# Patient Record
Sex: Female | Born: 1970
Health system: Southern US, Community
[De-identification: ages and names within clinical notes are randomized; demographics above are authoritative.]

---

## 2010-04-05 IMAGING — MG MM DIGITAL DIAGNOSTIC BILAT CAD
8 series · 8 of 8 positions shown · non-contrast
Comparison: None.

CLINICAL DATA: The patient notes palpable ridge-like area within
the medial inferior left breast.

DIGITAL DIAGNOSTIC BILATERAL MAMMOGRAM WITH CAD AND LEFT BREAST
ULTRASOUND:

[R CC (1 of 2)]
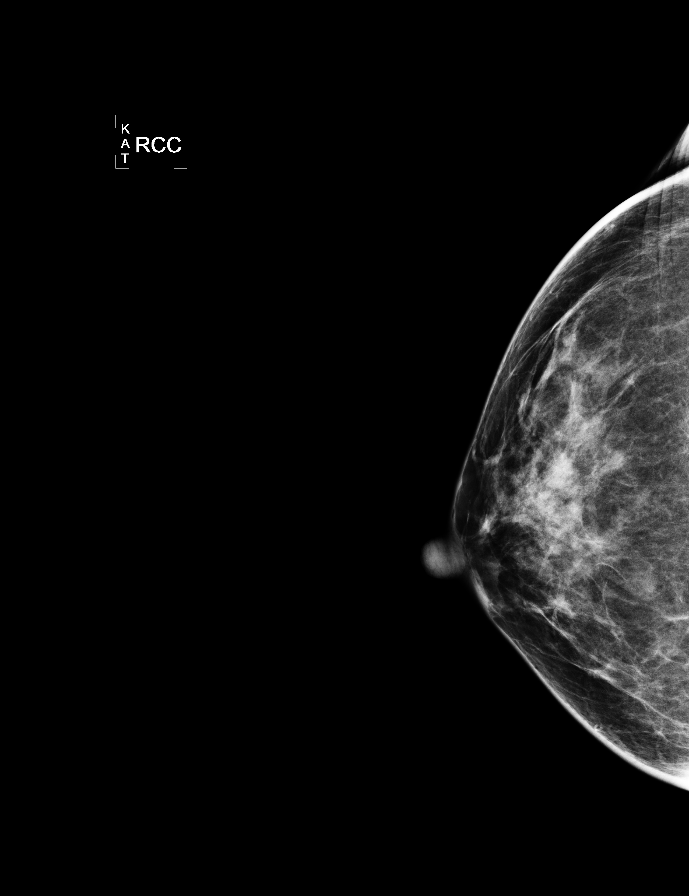

[L CC]
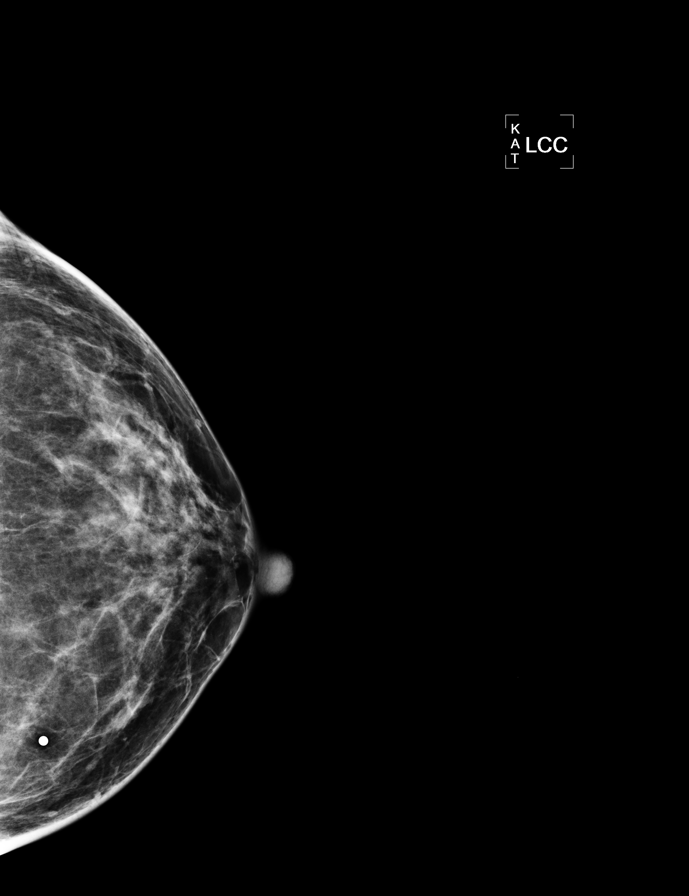

[L MLO (1 of 2)]
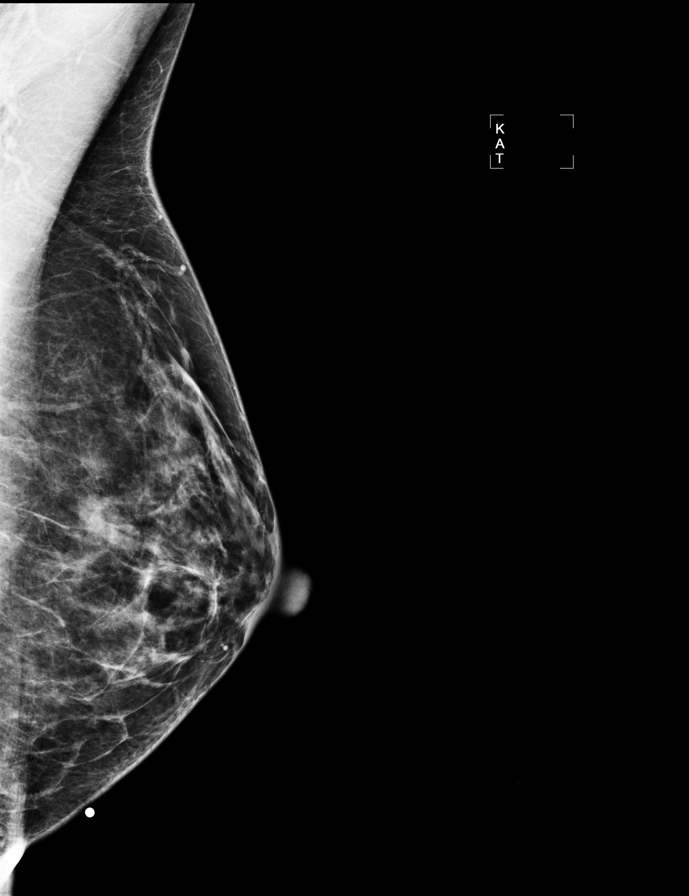

[R MLO (1 of 2)]
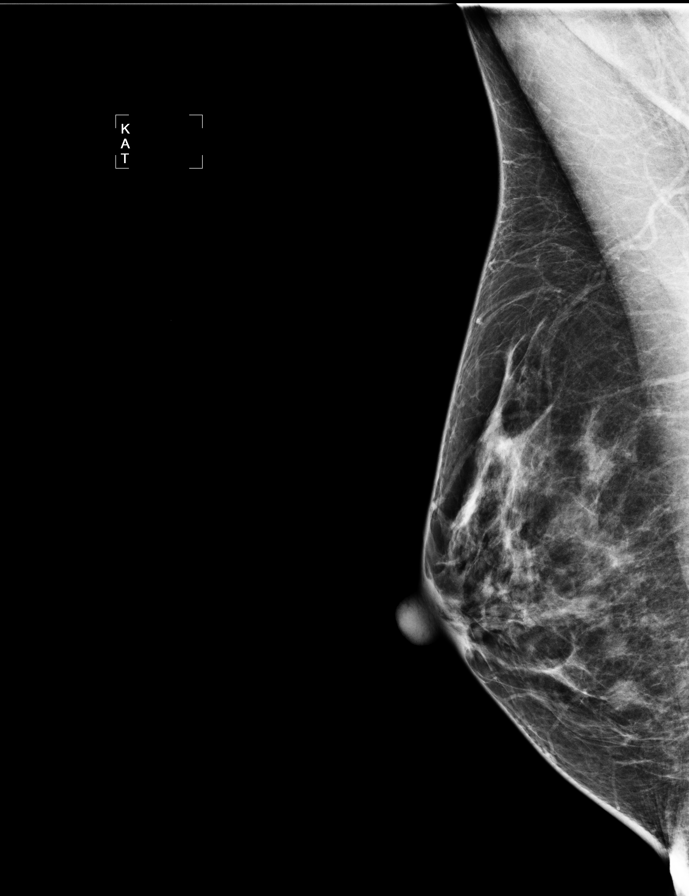

[L TAN]
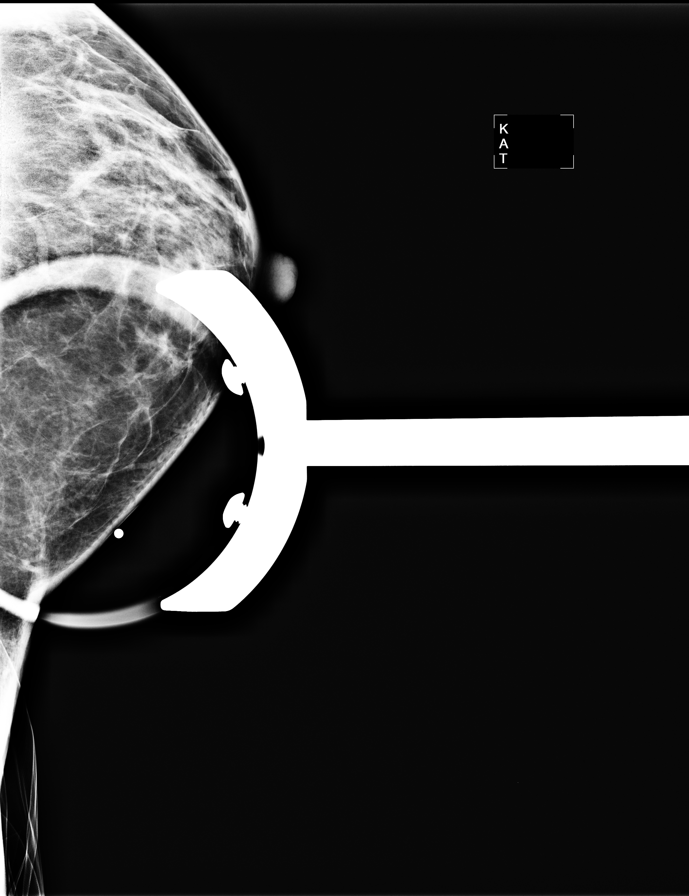

[L MLO (2 of 2)]
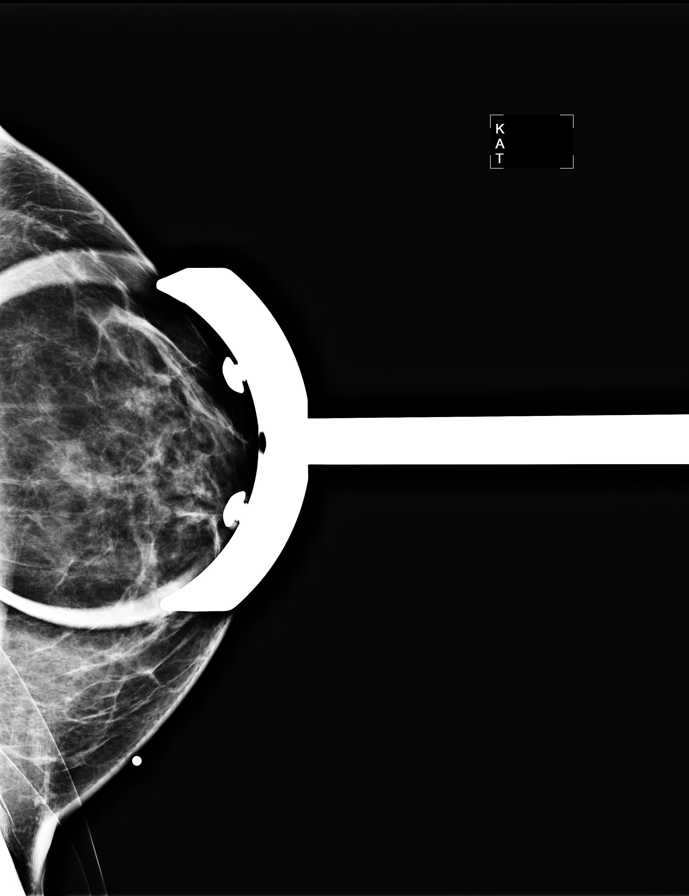

[R CC (2 of 2)]
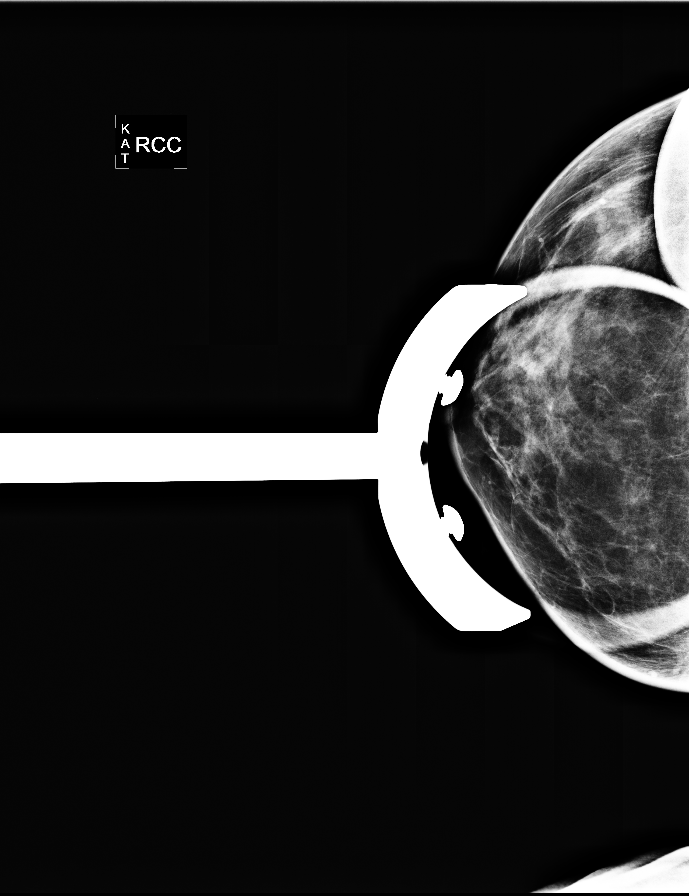

[R MLO (2 of 2)]
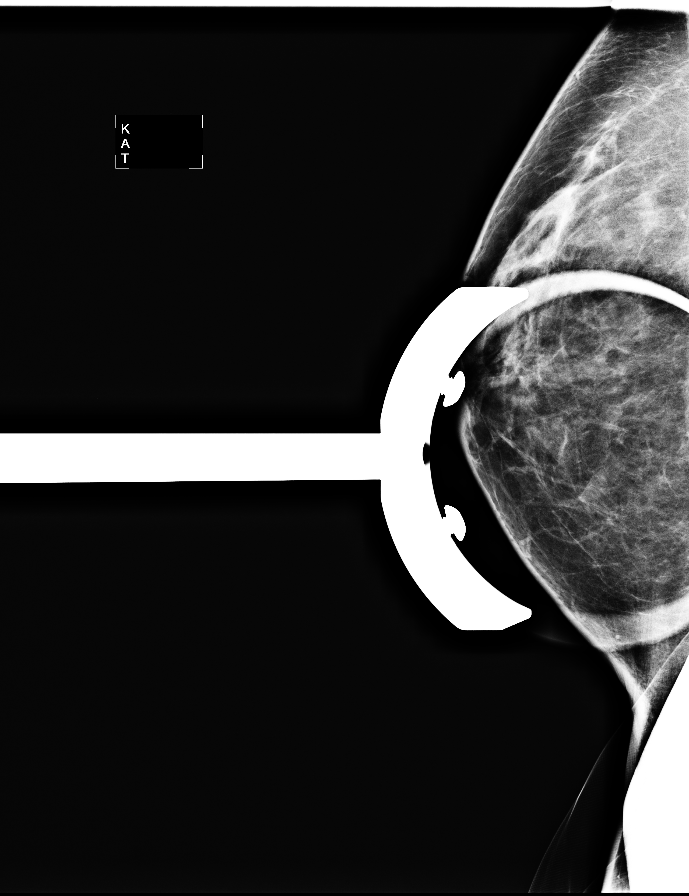

[8 of 8 positions shown; findings below may reference images not displayed]

FINDINGS: There is a scattered fibroglandular parenchymal pattern.
There is no mass, distortion, or worrisome calcification.
Mammographic images were processed with CAD.

On physical exam, the palpable ridge-like area located within the
medial inferior left breast corresponds to a prominent
costochondral junction.  There is no breast mass.

Ultrasound is performed, showing normal-appearing fibroglandular
tissue within the medial and inferior left breast.  The palpable
ridge-like area noted by the patient corresponds to the patient's
costochondral junction.
IMPRESSION: No findings worrisome for malignancy.  Recommend screening
mammography in 1 year.

BI-RADS CATEGORY 1:  Negative.

## 2011-10-30 IMAGING — CT CT HEAD W/O CM
1 series · 16 of 30 positions shown, 20 images · non-contrast
Comparison: None

CLINICAL DATA: 40-year-old female with headache and dizziness
following head injury.

CT HEAD WITHOUT CONTRAST
TECHNIQUE: Contiguous axial images were obtained from the base of
the skull through the vertex without contrast.

[Series 2: headseq 4.8 h45s · axial · 0.43mm/px · z∈[+1045,+1175]mm · 16 of 30 slices shown, 20 images]
[im 2/30  brain]
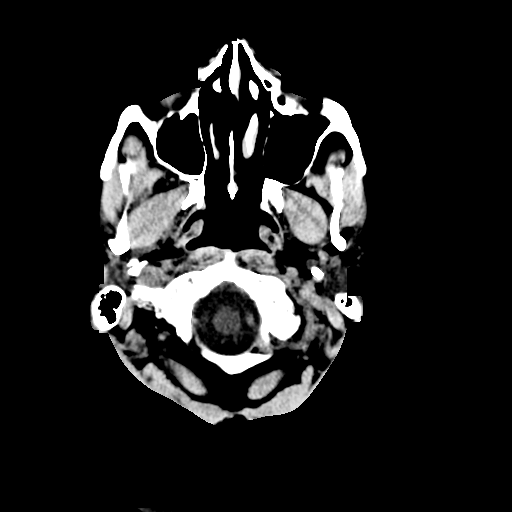
[im 2/30  bone]
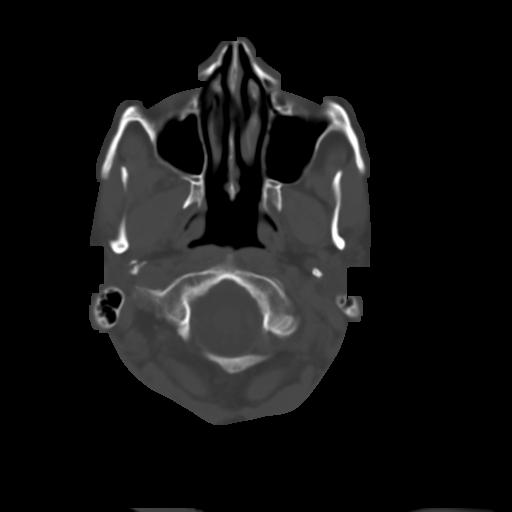
[im 4/30  brain]
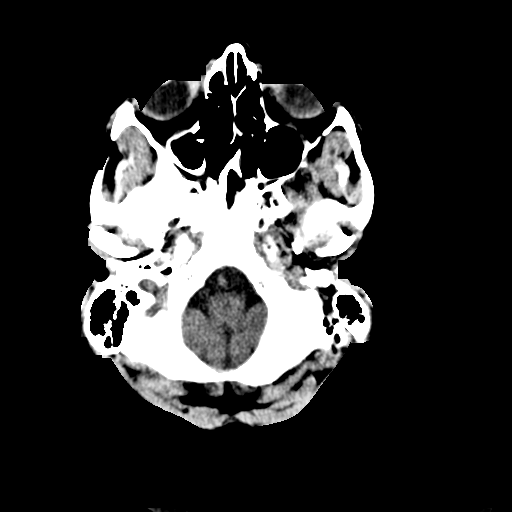
[im 6/30  brain]
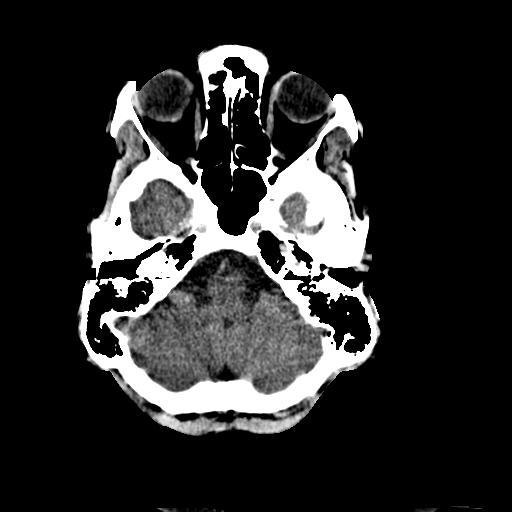
[im 8/30  brain]
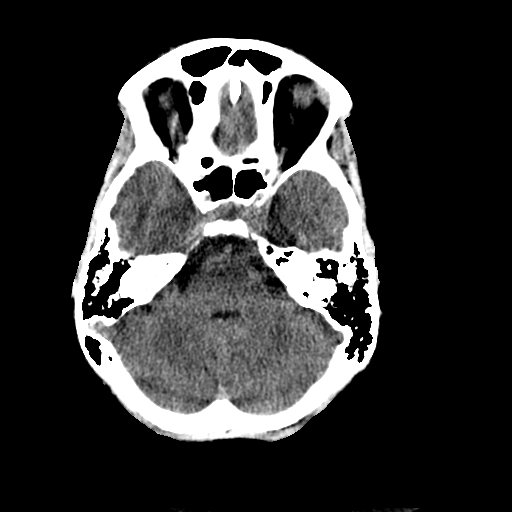
[im 9/30  brain]
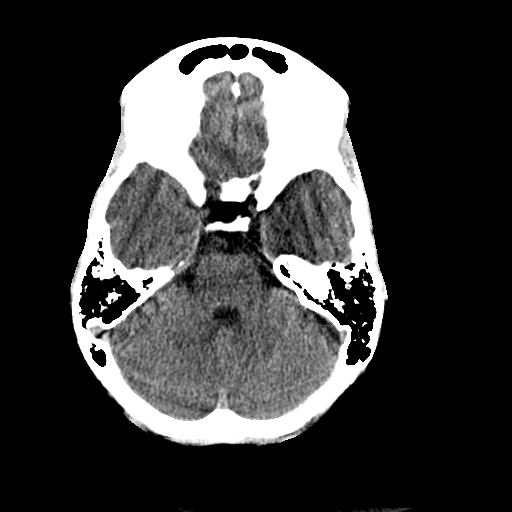
[im 9/30  bone]
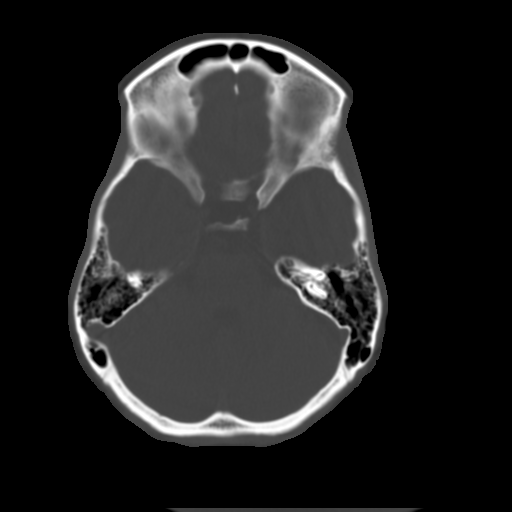
[im 11/30  brain]
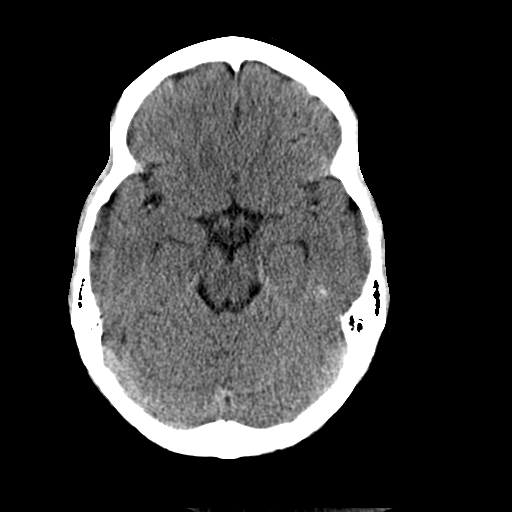
[im 13/30  brain]
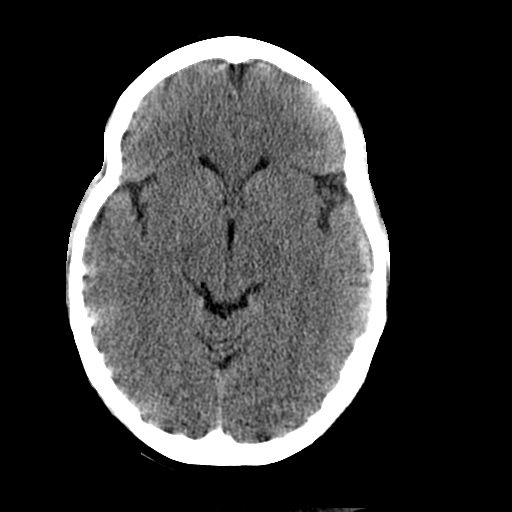
[im 15/30  brain]
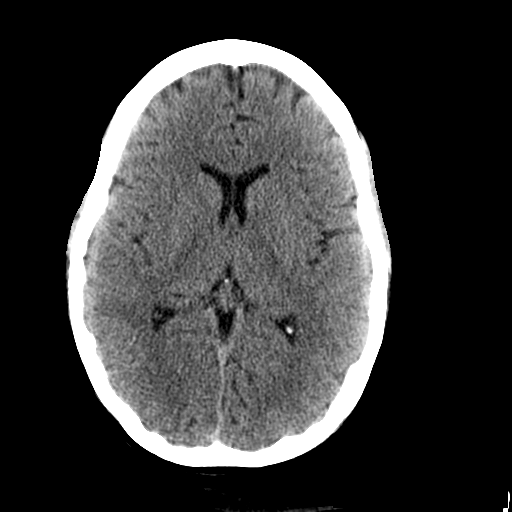
[im 16/30  brain]
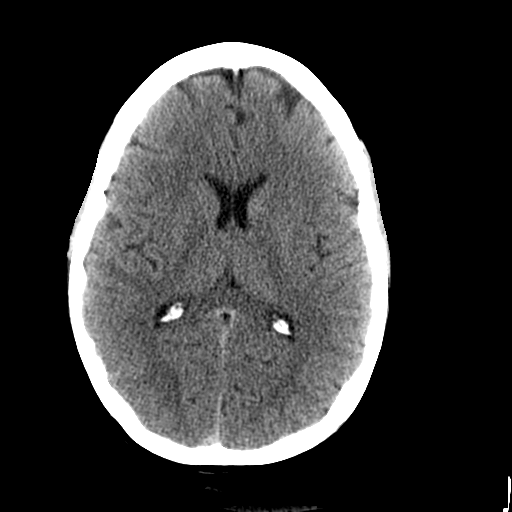
[im 16/30  bone]
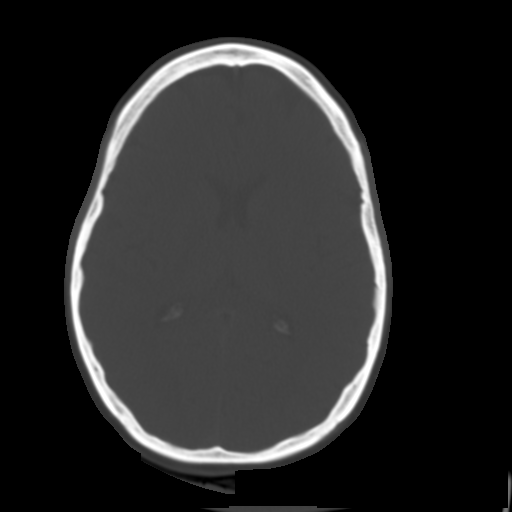
[im 18/30  brain]
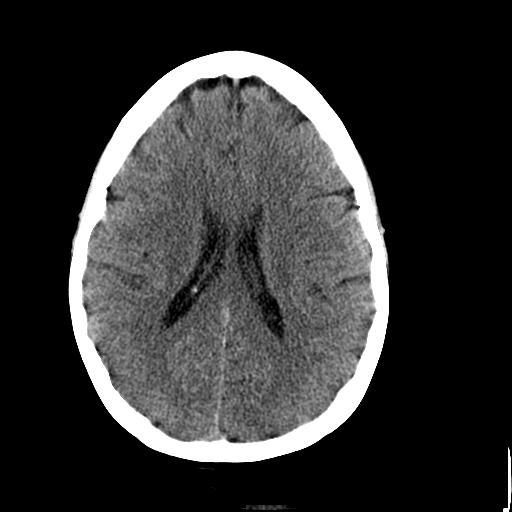
[im 20/30  brain]
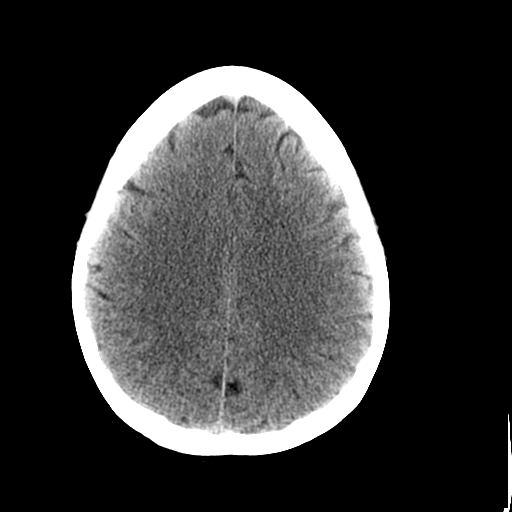
[im 22/30  brain]
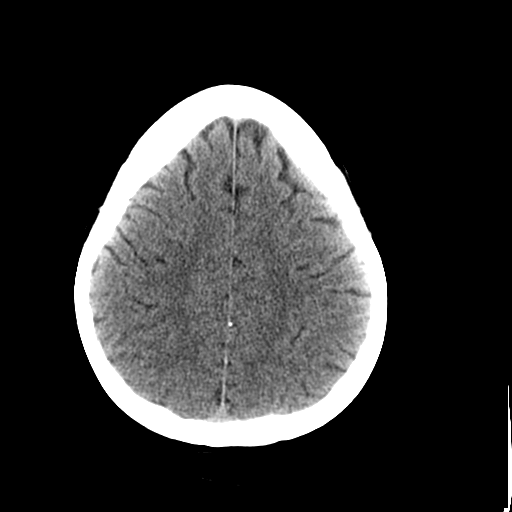
[im 23/30  brain]
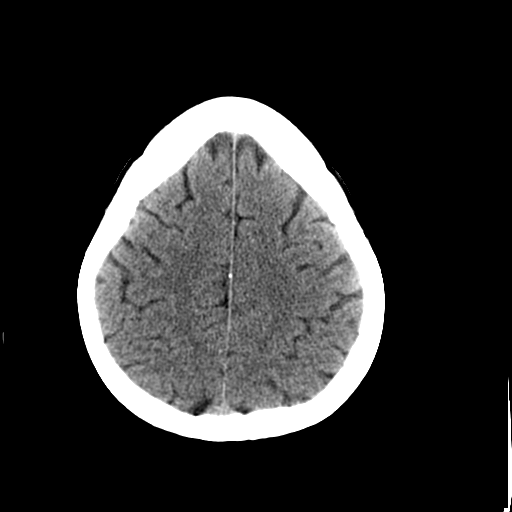
[im 23/30  bone]
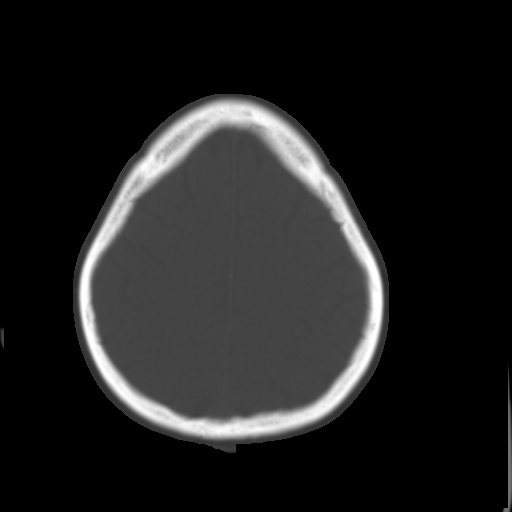
[im 25/30  brain]
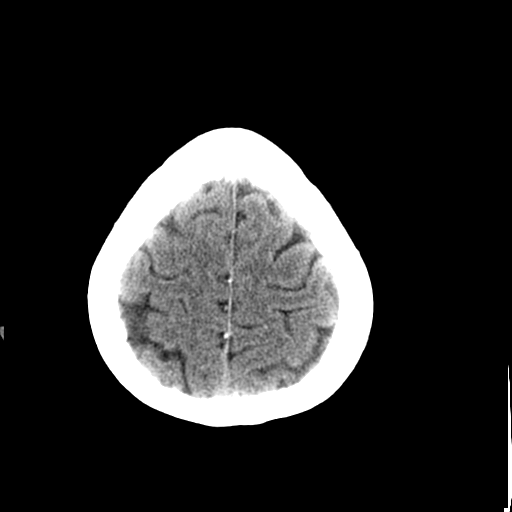
[im 27/30  brain]
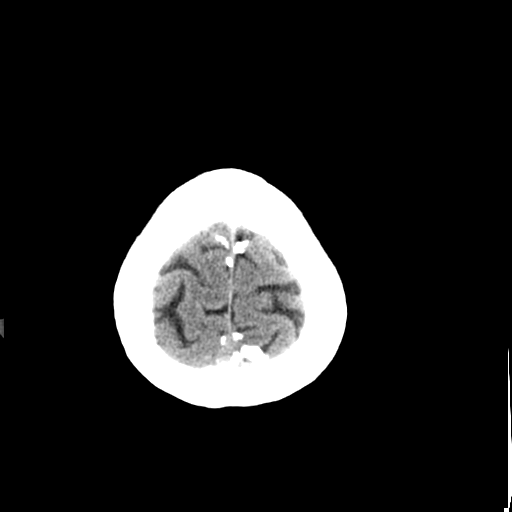
[im 29/30  brain]
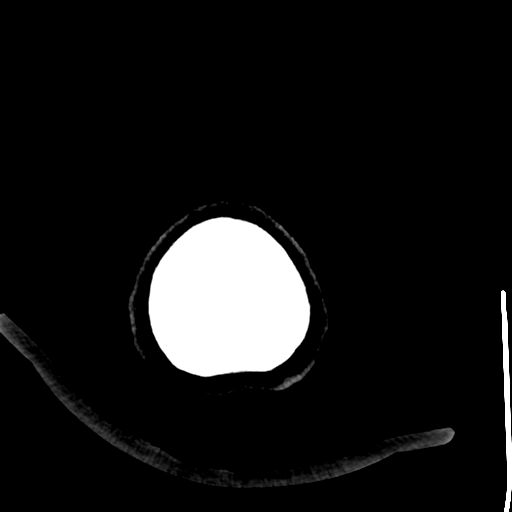

[16 of 30 positions shown; findings below may reference images not displayed]

FINDINGS: No intracranial abnormalities are identified, including
mass lesion or mass effect, hydrocephalus, extra-axial fluid
collection, midline shift, hemorrhage, or acute infarction.

The visualized bony calvarium is unremarkable.
IMPRESSION: Unremarkable noncontrast head CT.

## 2011-12-05 IMAGING — US US BREAST RIGHT
1 series · 4 of 4 positions shown · non-contrast
Comparison: [DATE] [DATE], [DATE], [DATE] [DATE], [DATE]

CLINICAL DATA: Called back from screening mammogram for possible
mass right breast

DIGITAL DIAGNOSTIC RIGHT MAMMOGRAM [DATE] AND RIGHT
BREAST ULTRASOUND:

[Series 1: us breast right · 4 of 4 slices shown]
[im 1/4]
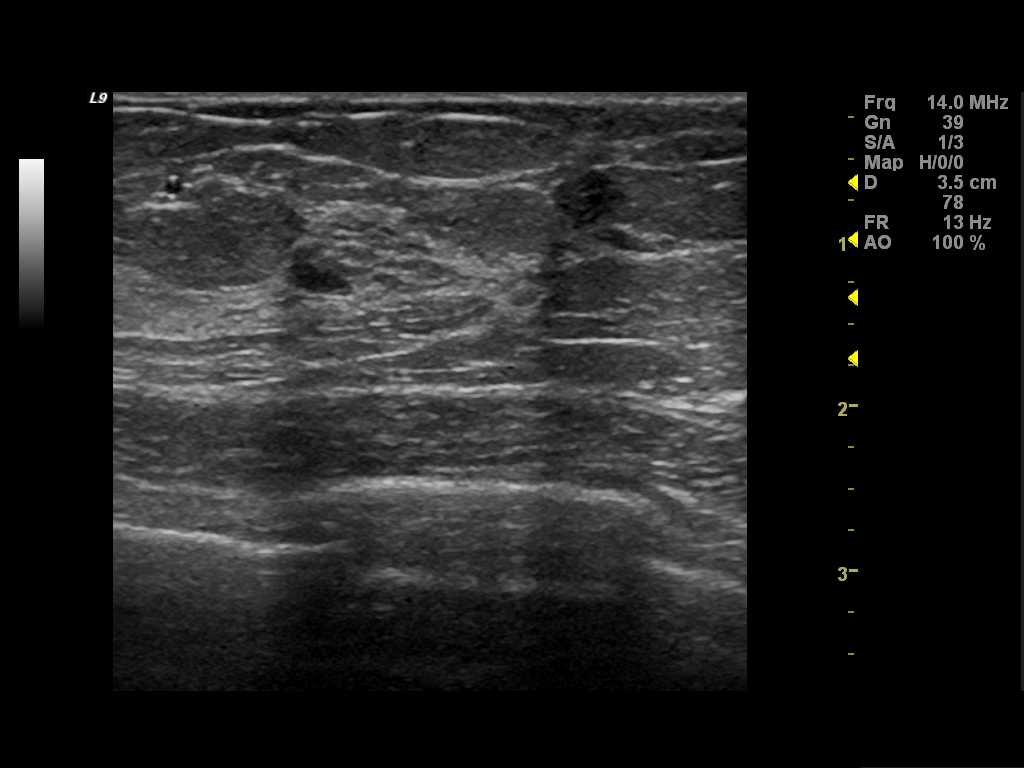
[im 2/4]
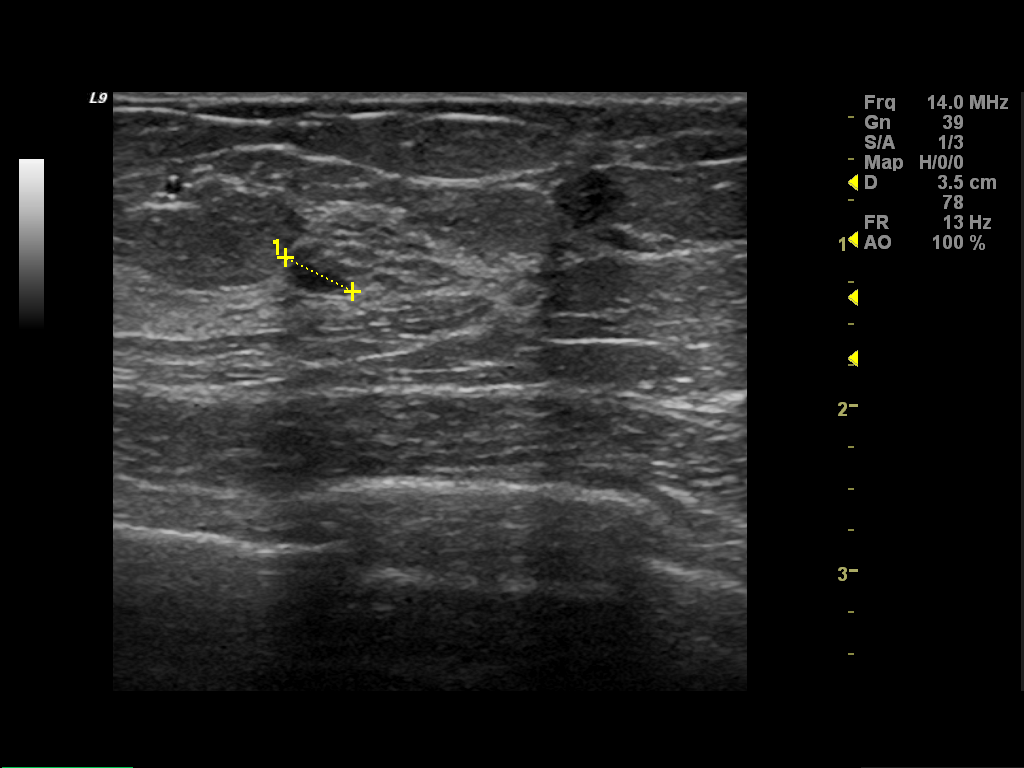
[im 3/4]
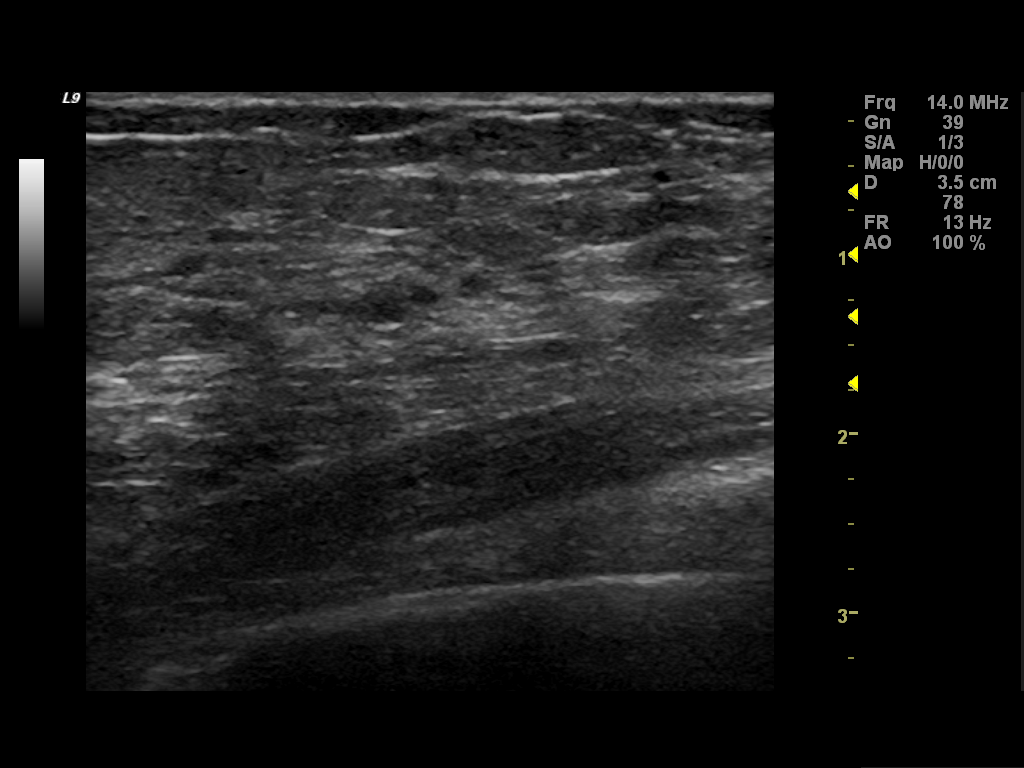
[im 4/4]
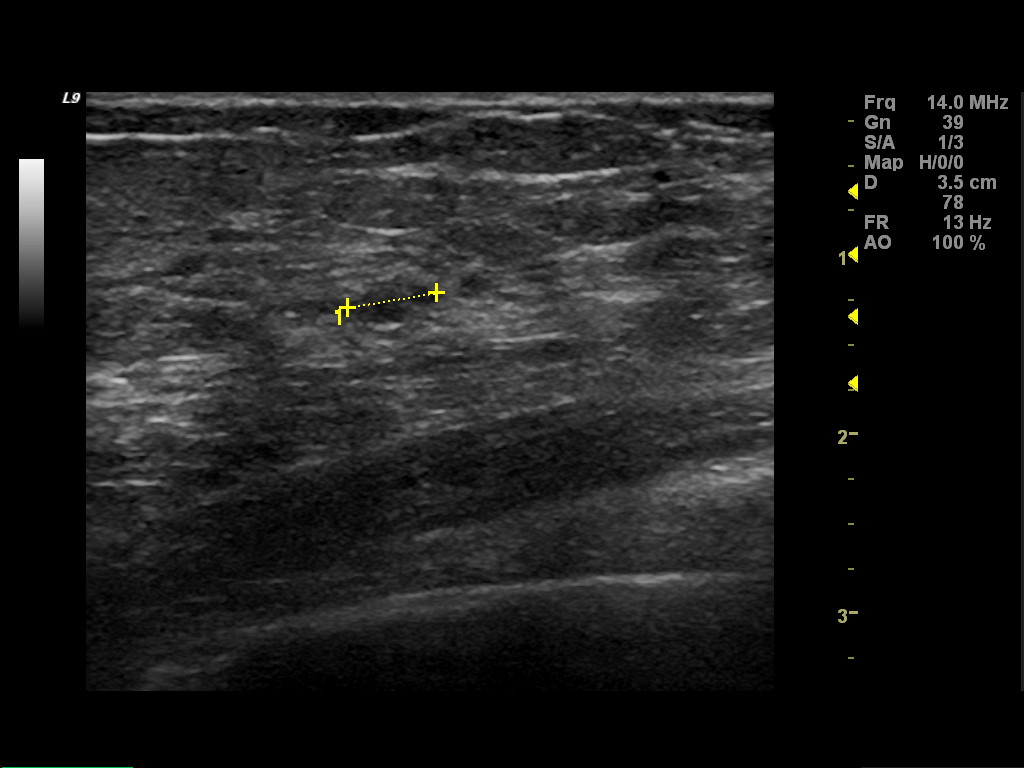

[4 of 4 positions shown; findings below may reference images not displayed]

FINDINGS: Spot compression CC and MLO views of the right breast
are submitted.  Previously questioned asymmetry does not persist on
additional views.  On the additional views, the breast parenchyma
is unchanged compared to previous mammograms.

Ultrasound is performed, showing a 0.5 cm oval hypoechoic lesion
with central increased echo texture at the right breast [DATE]
position 2 cm from the nipple consistent with intramammary lymph
node.
IMPRESSION: Benign findings, recommend routine screening mammogram back on
schedule.

BI-RADS CATEGORY 2:  Benign finding(s).

## 2011-12-05 IMAGING — MG MM DIGITAL DIAGNOSTIC UNILAT*R*
2 series · 2 of 2 positions shown · non-contrast
Comparison: [DATE] [DATE], [DATE], [DATE] [DATE], [DATE]

CLINICAL DATA: Called back from screening mammogram for possible
mass right breast

DIGITAL DIAGNOSTIC RIGHT MAMMOGRAM [DATE] AND RIGHT
BREAST ULTRASOUND:

[R CC]
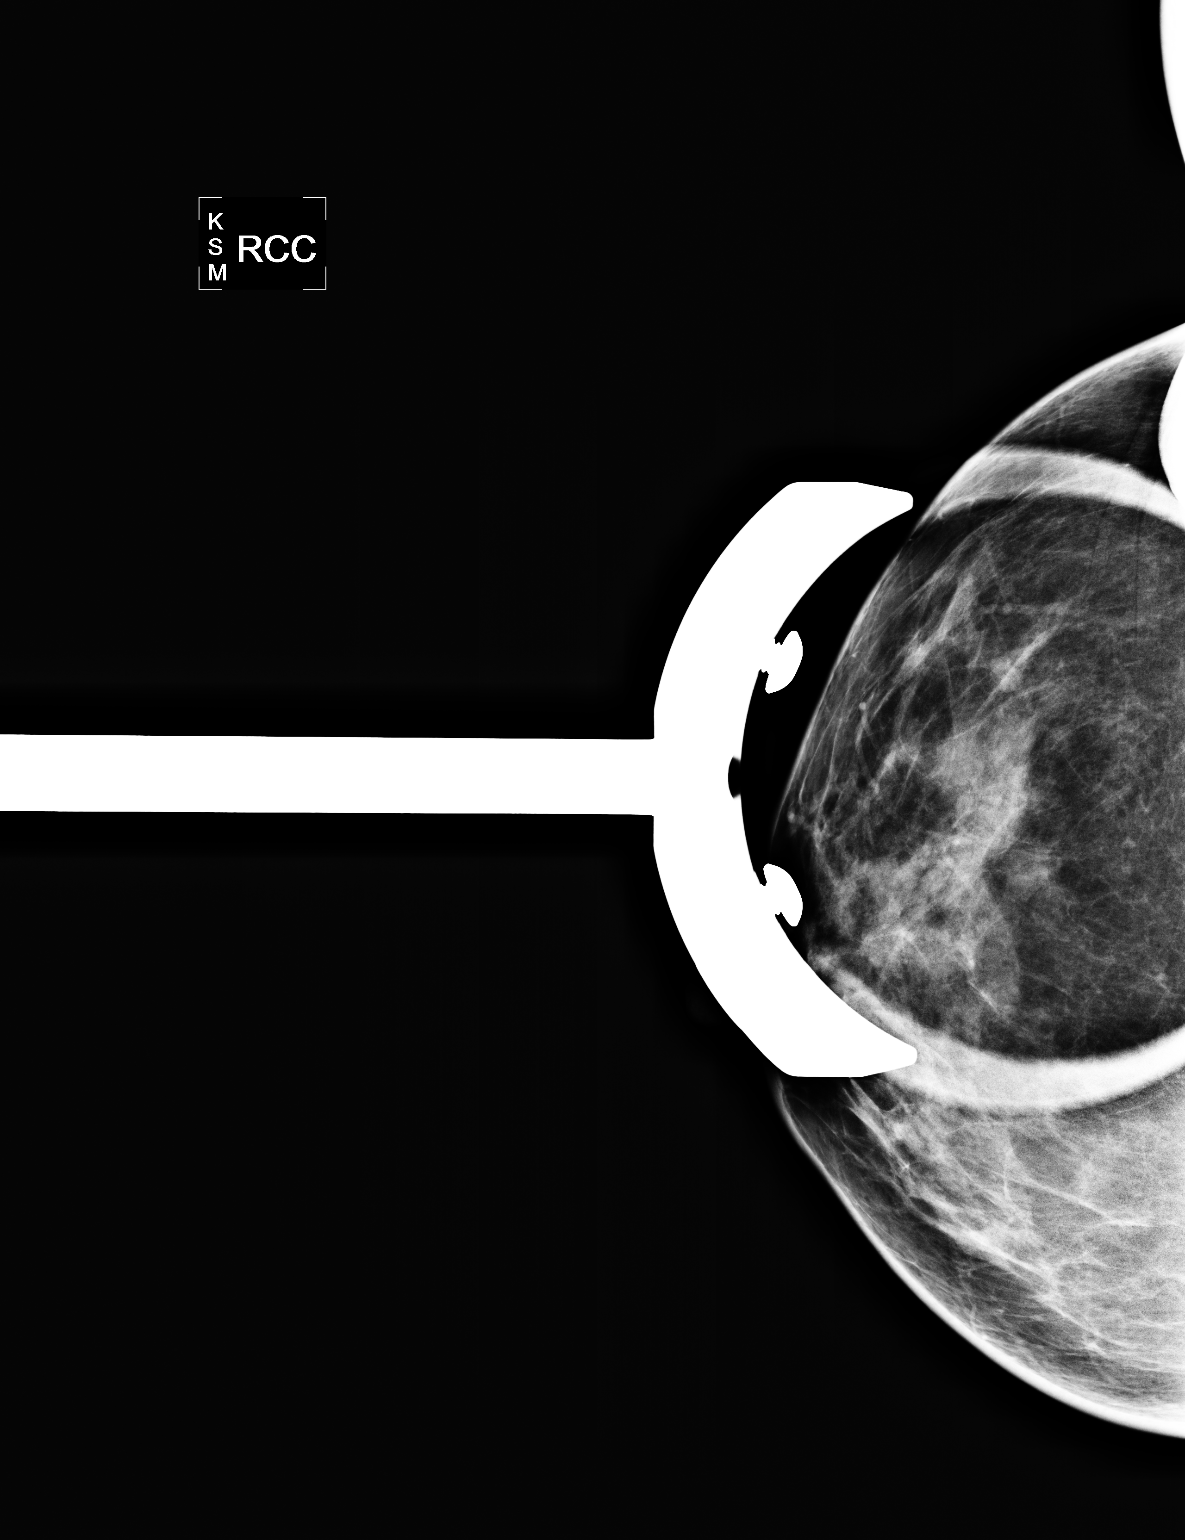

[R MLO]
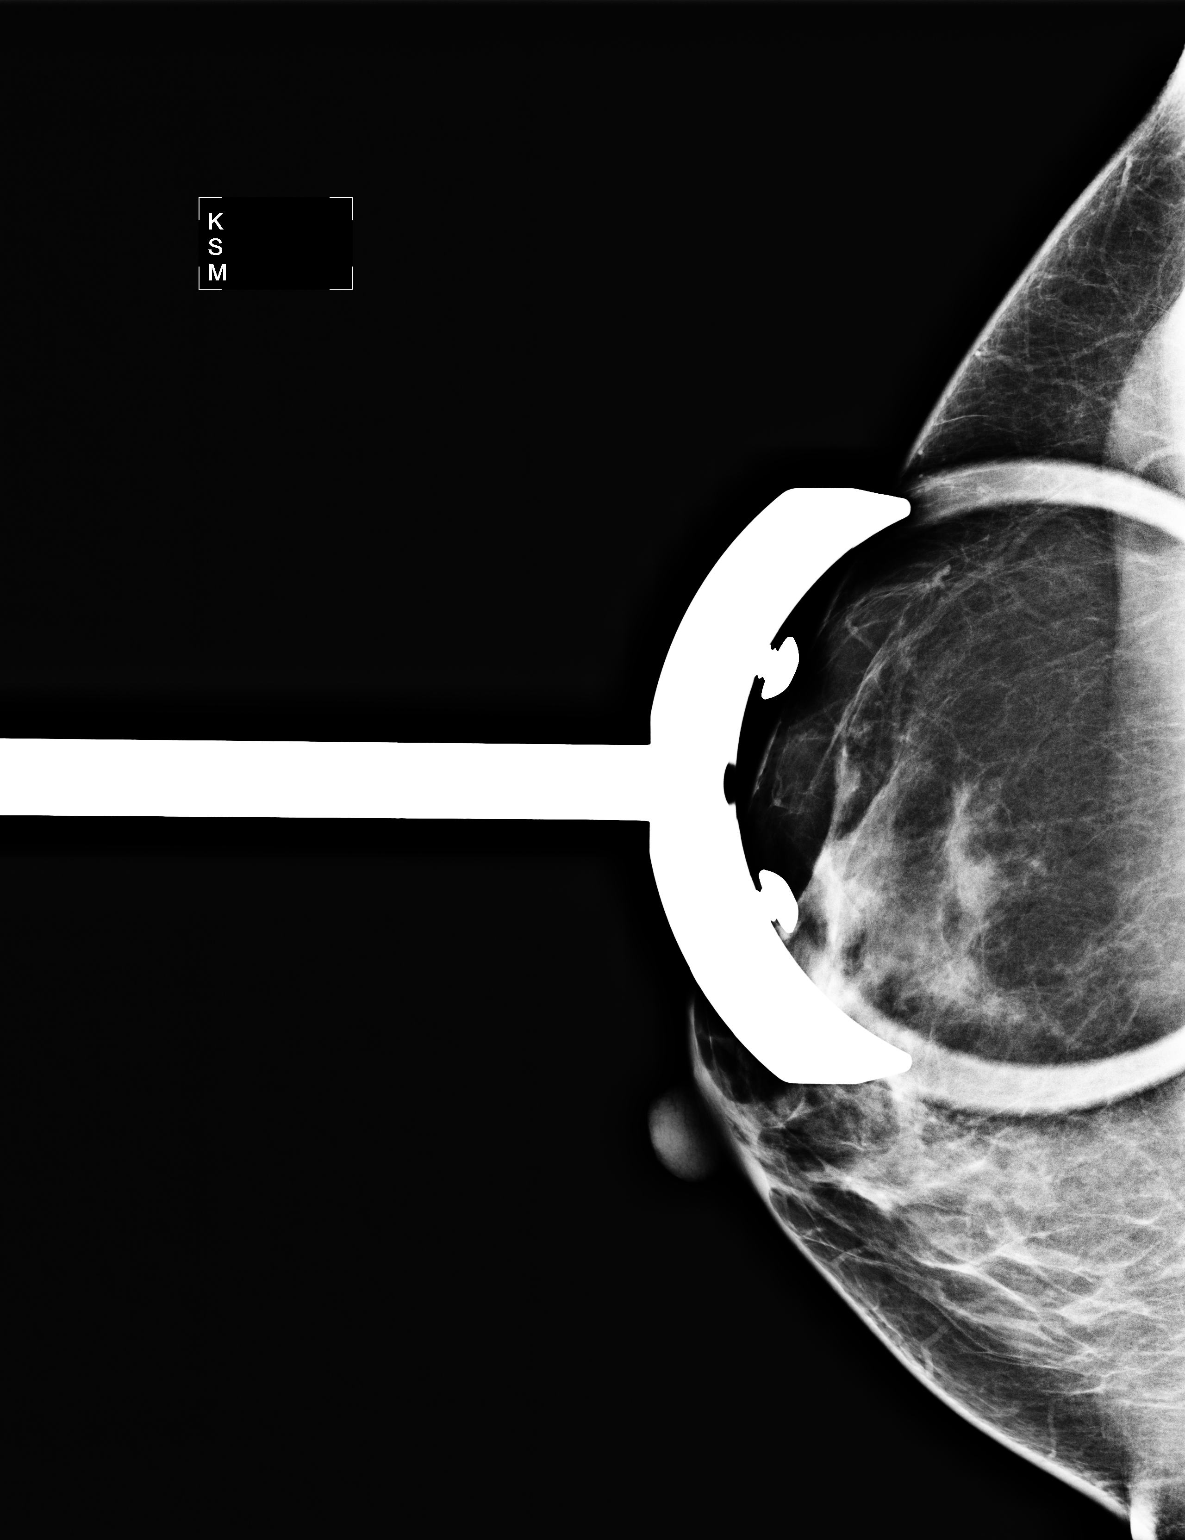

[2 of 2 positions shown; findings below may reference images not displayed]

FINDINGS: Spot compression CC and MLO views of the right breast
are submitted.  Previously questioned asymmetry does not persist on
additional views.  On the additional views, the breast parenchyma
is unchanged compared to previous mammograms.

Ultrasound is performed, showing a 0.5 cm oval hypoechoic lesion
with central increased echo texture at the right breast [DATE]
position 2 cm from the nipple consistent with intramammary lymph
node.
IMPRESSION: Benign findings, recommend routine screening mammogram back on
schedule.

BI-RADS CATEGORY 2:  Benign finding(s).

## 2013-08-15 IMAGING — MG MM DIAGNOSTIC UNILATERAL L
3 series · 3 of 3 positions shown · non-contrast
Comparison: [DATE], [DATE], [DATE].

CLINICAL DATA: Callback from screening mammography, possible left
breast mass.

EXAM:
DIGITAL DIAGNOSTIC  LEFT MAMMOGRAM

[L MLO (1 of 2)]
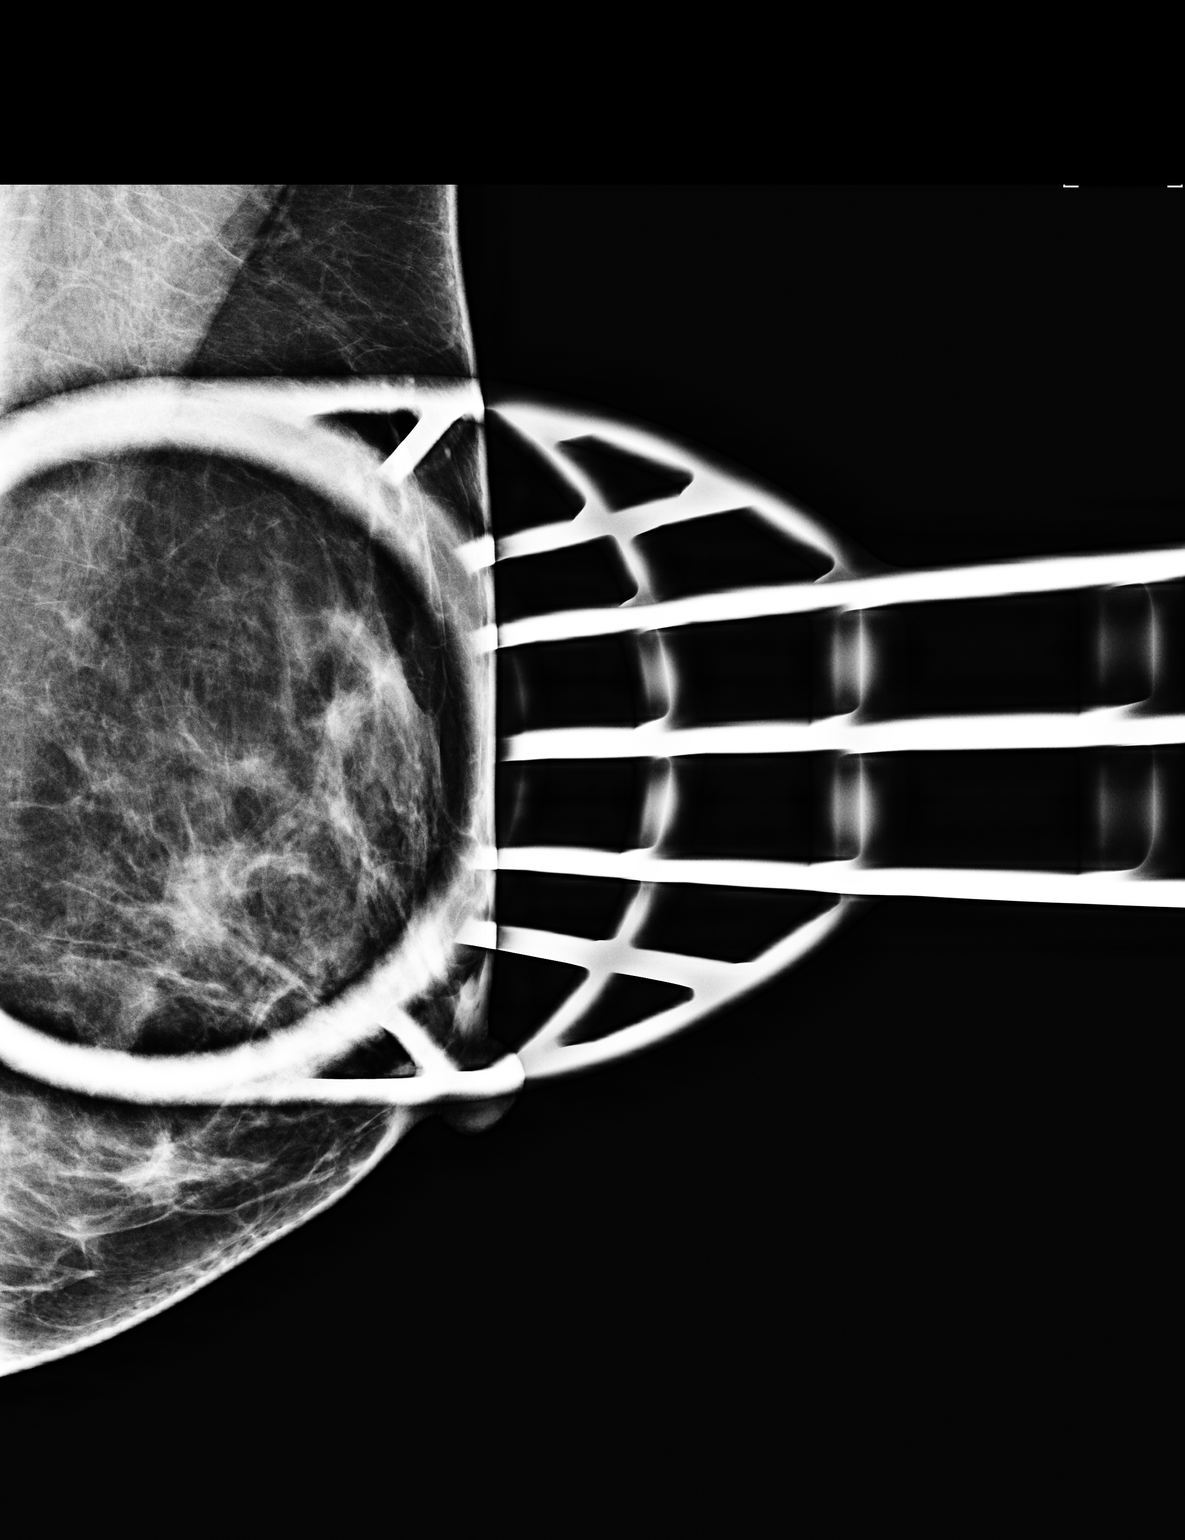

[L CC]
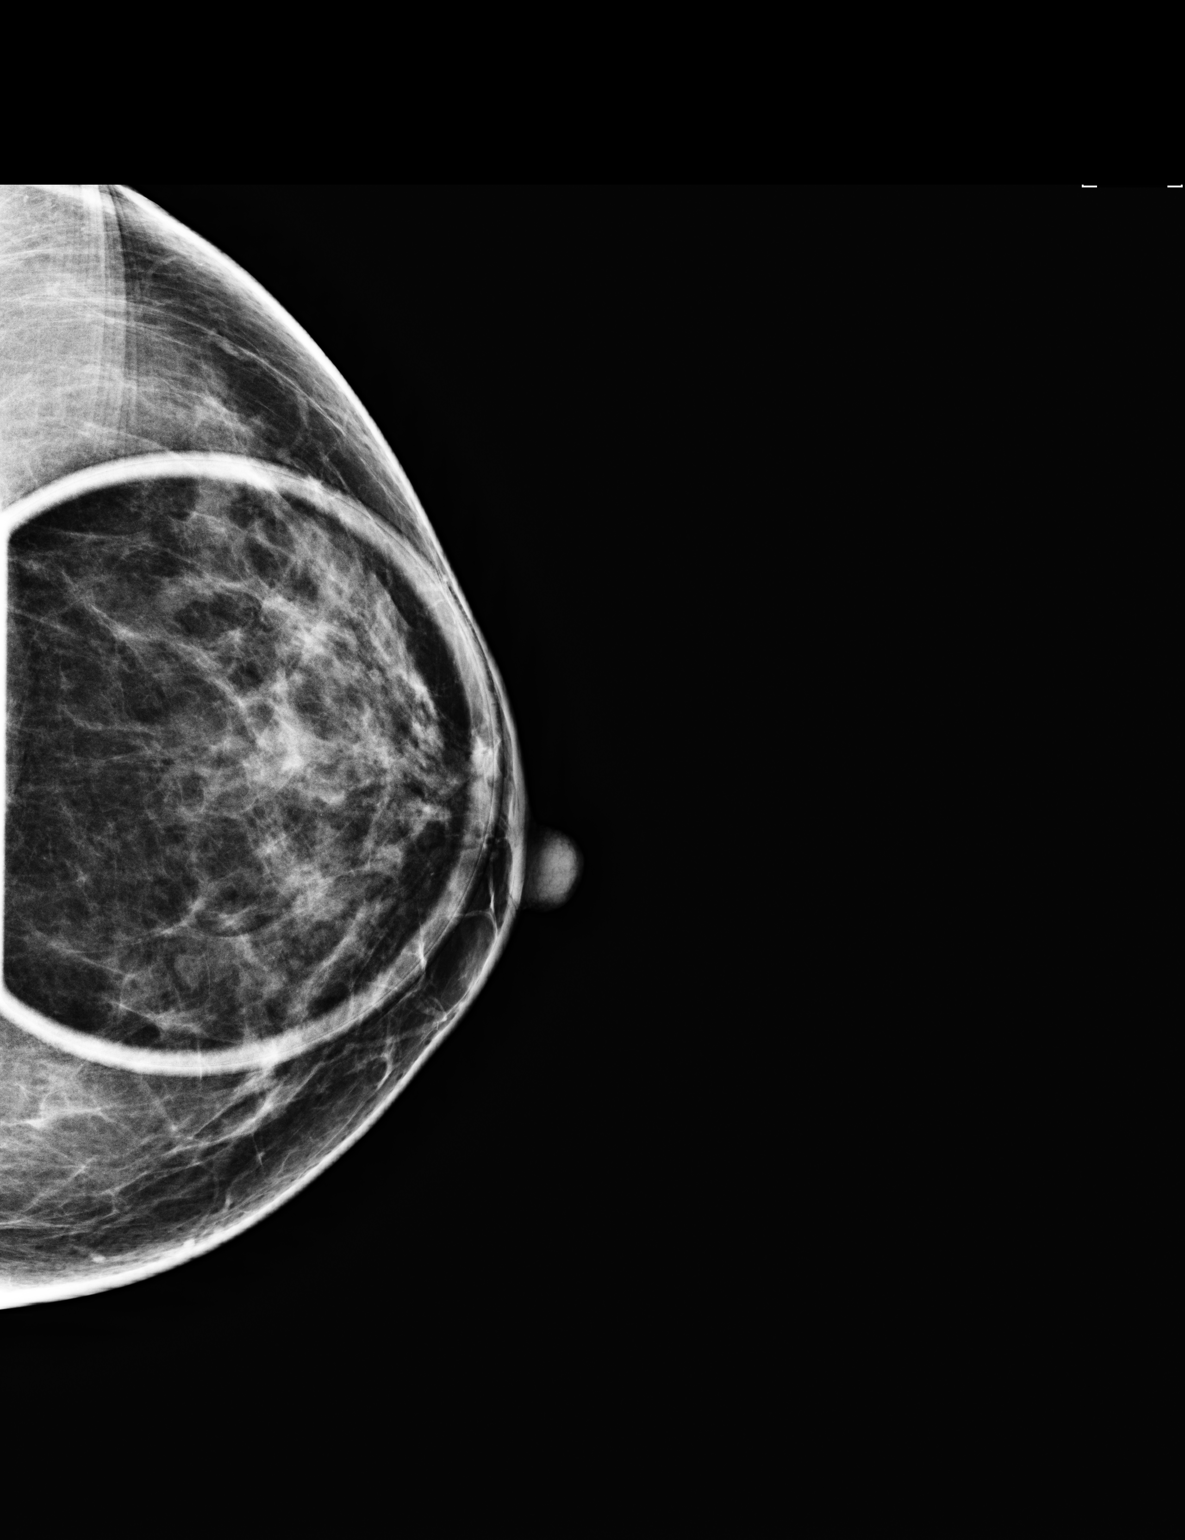

[L MLO (2 of 2)]
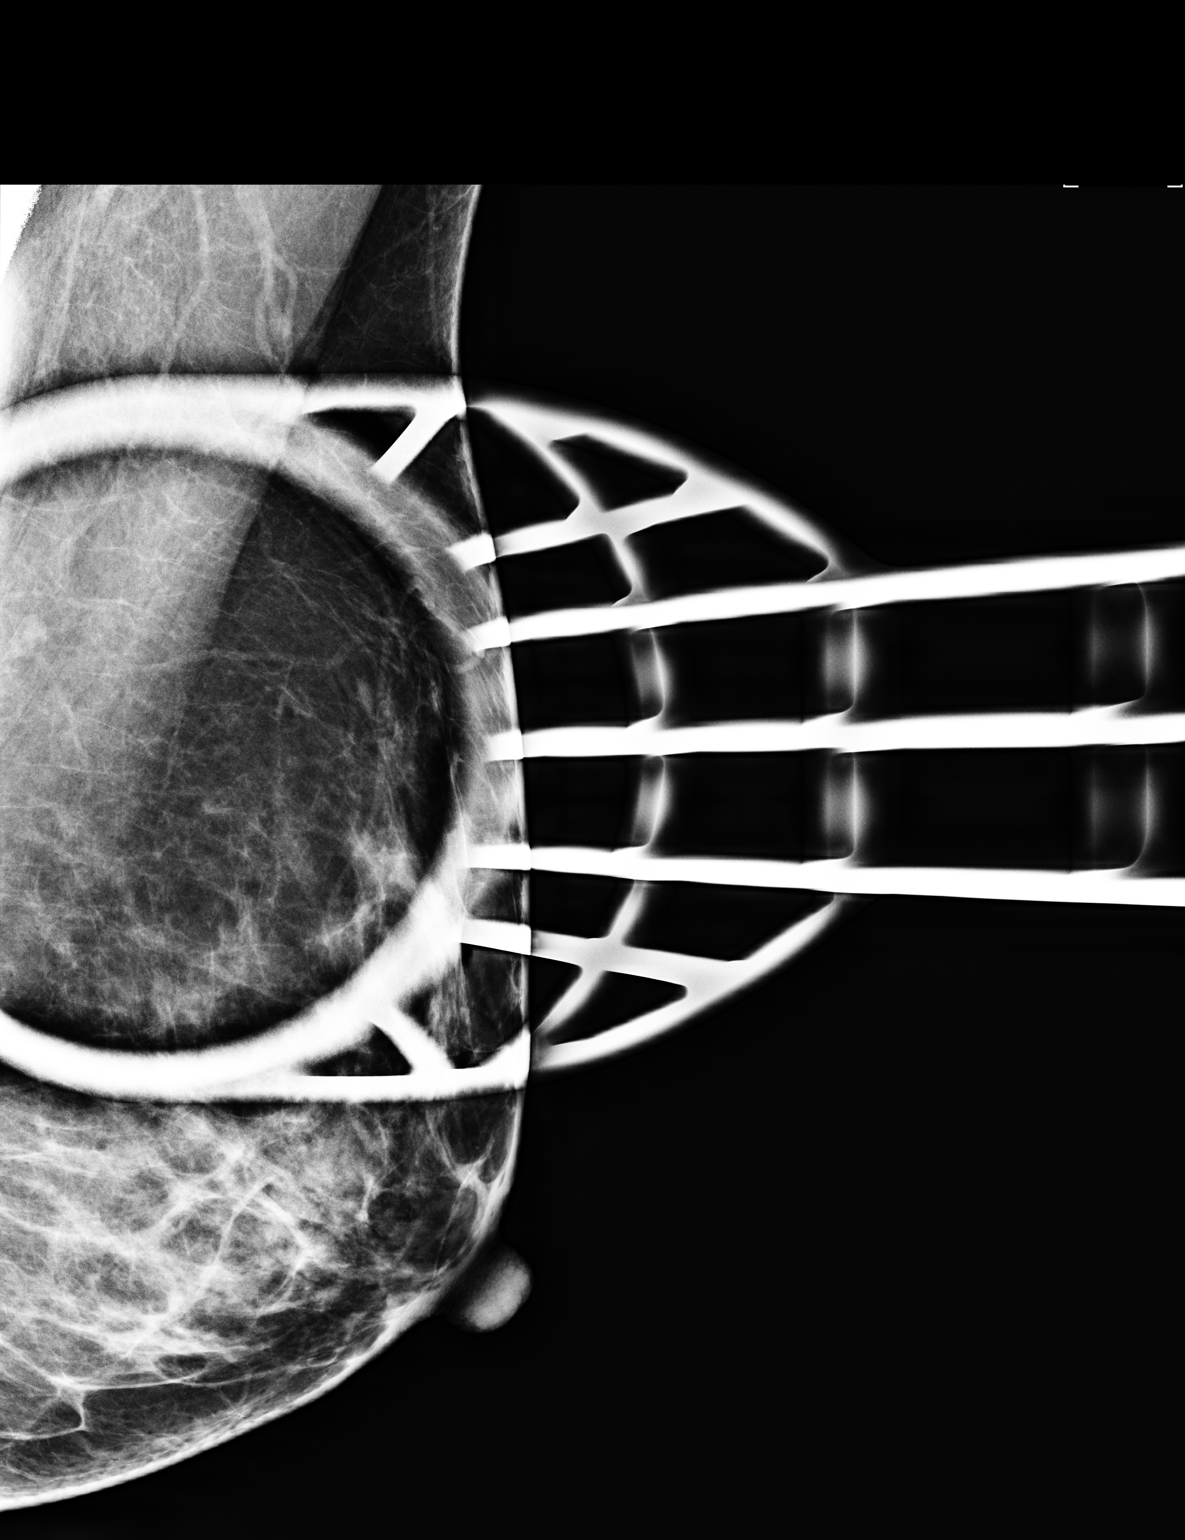

[3 of 3 positions shown; findings below may reference images not displayed]

ACR Breast Density Category b: There are scattered areas of
fibroglandular density.
FINDINGS: Spot compression CC and MLO views of the area of concern in the
upper left breast were obtained. No persistent mass, architectural
distortion or suspicious calcification in the area of concern on the
screening mammogram.
IMPRESSION: No mammographic evidence of malignancy, left breast. The area of
concern on the screening mammogram is consistent with a summation of
overlapping fibroglandular tissue.

RECOMMENDATION:
Screening mammogram in one year.(Code:[FD])

I have discussed the findings and recommendations with the patient.
Results were also provided in writing at the conclusion of the
visit. If applicable, a reminder letter will be sent to the patient
regarding the next appointment.

BI-RADS CATEGORY  1: Negative

## 2016-03-20 IMAGING — CR DG CHEST 2V
2 series · 2 of 2 positions shown · non-contrast
Comparison: None.

CLINICAL DATA: SOB, some chest discomfort and left side shoulder
pain radiating down entire left arm, all onset today. No chest hx.
Some day smoker.

EXAM:
CHEST  2 VIEW

[w chest pa]
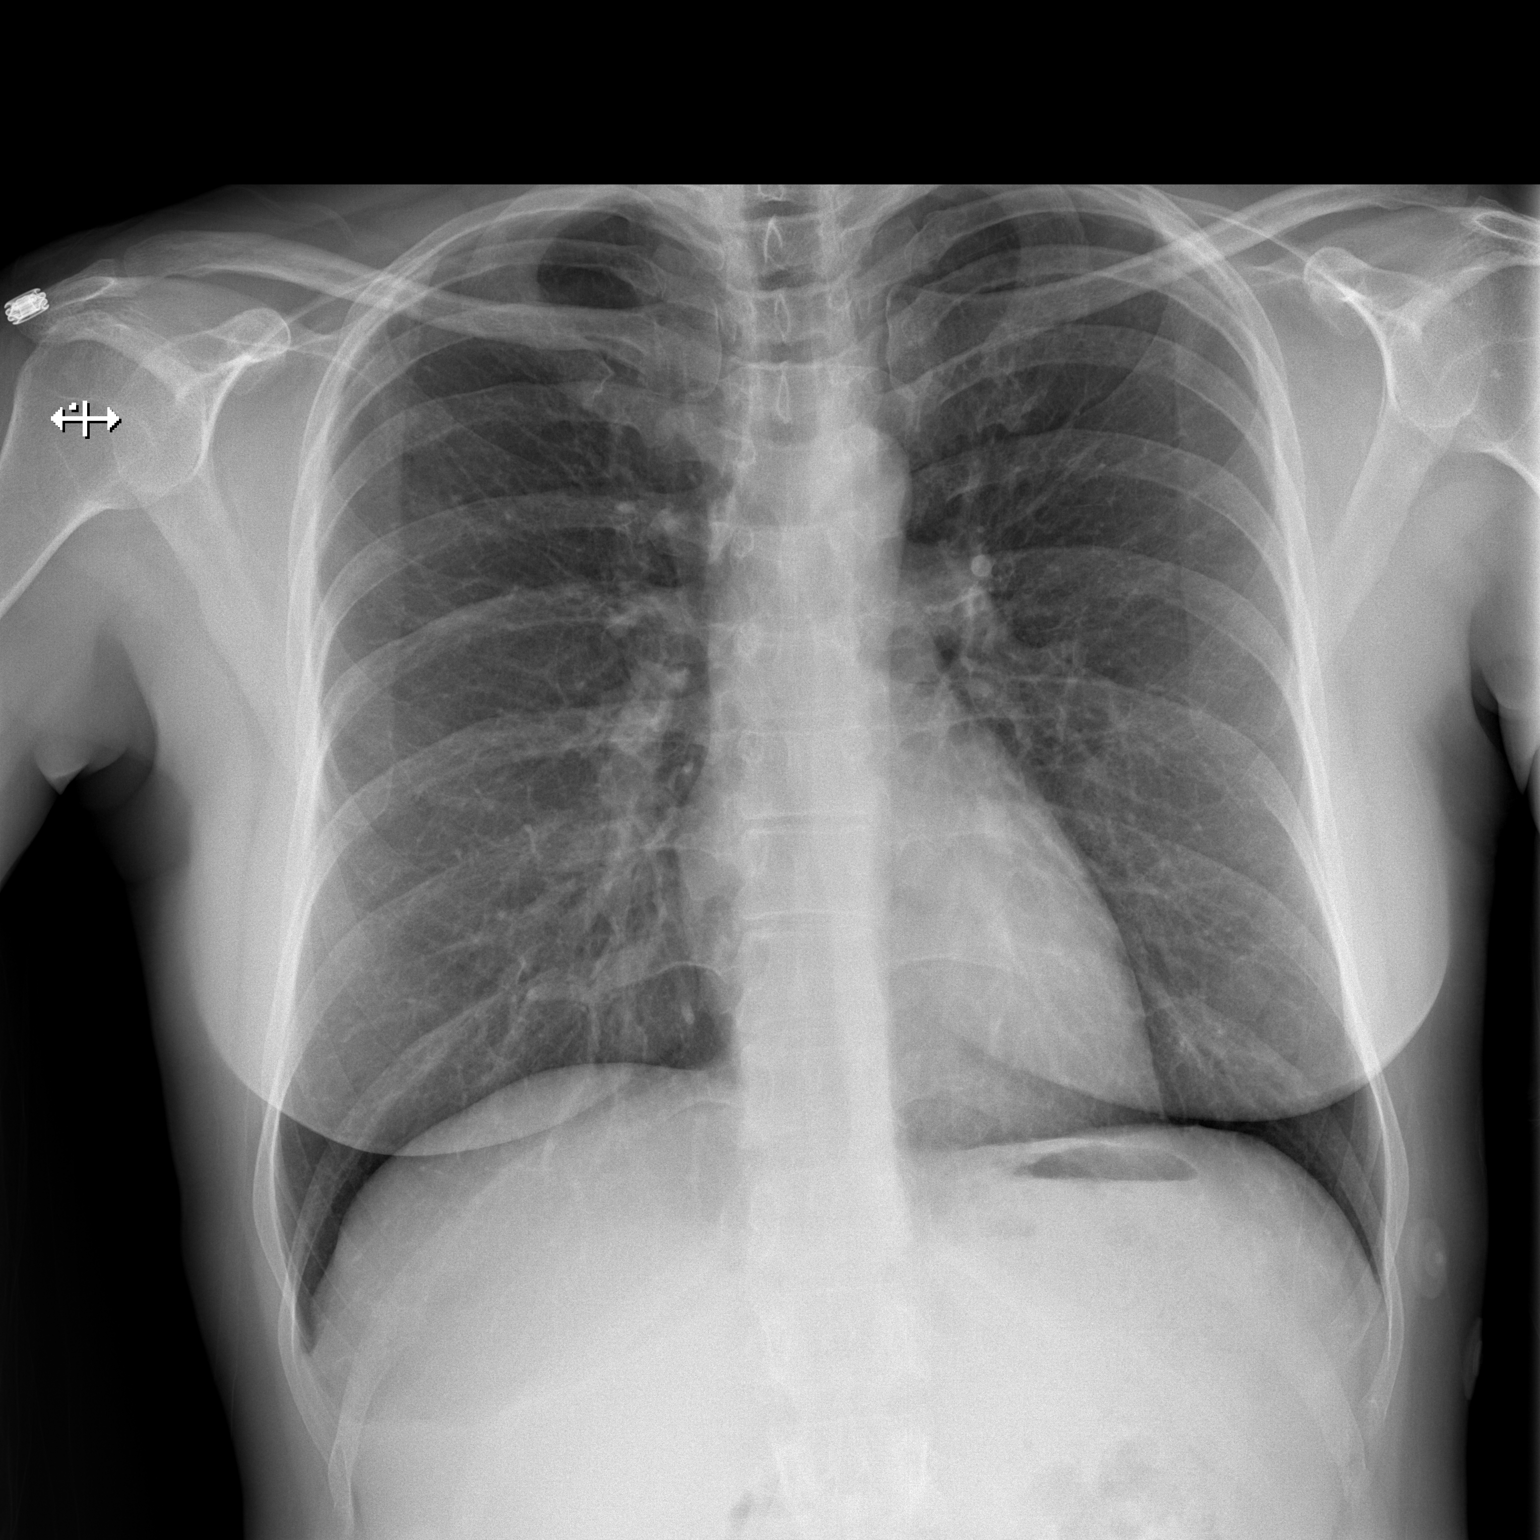

[w chest lat]
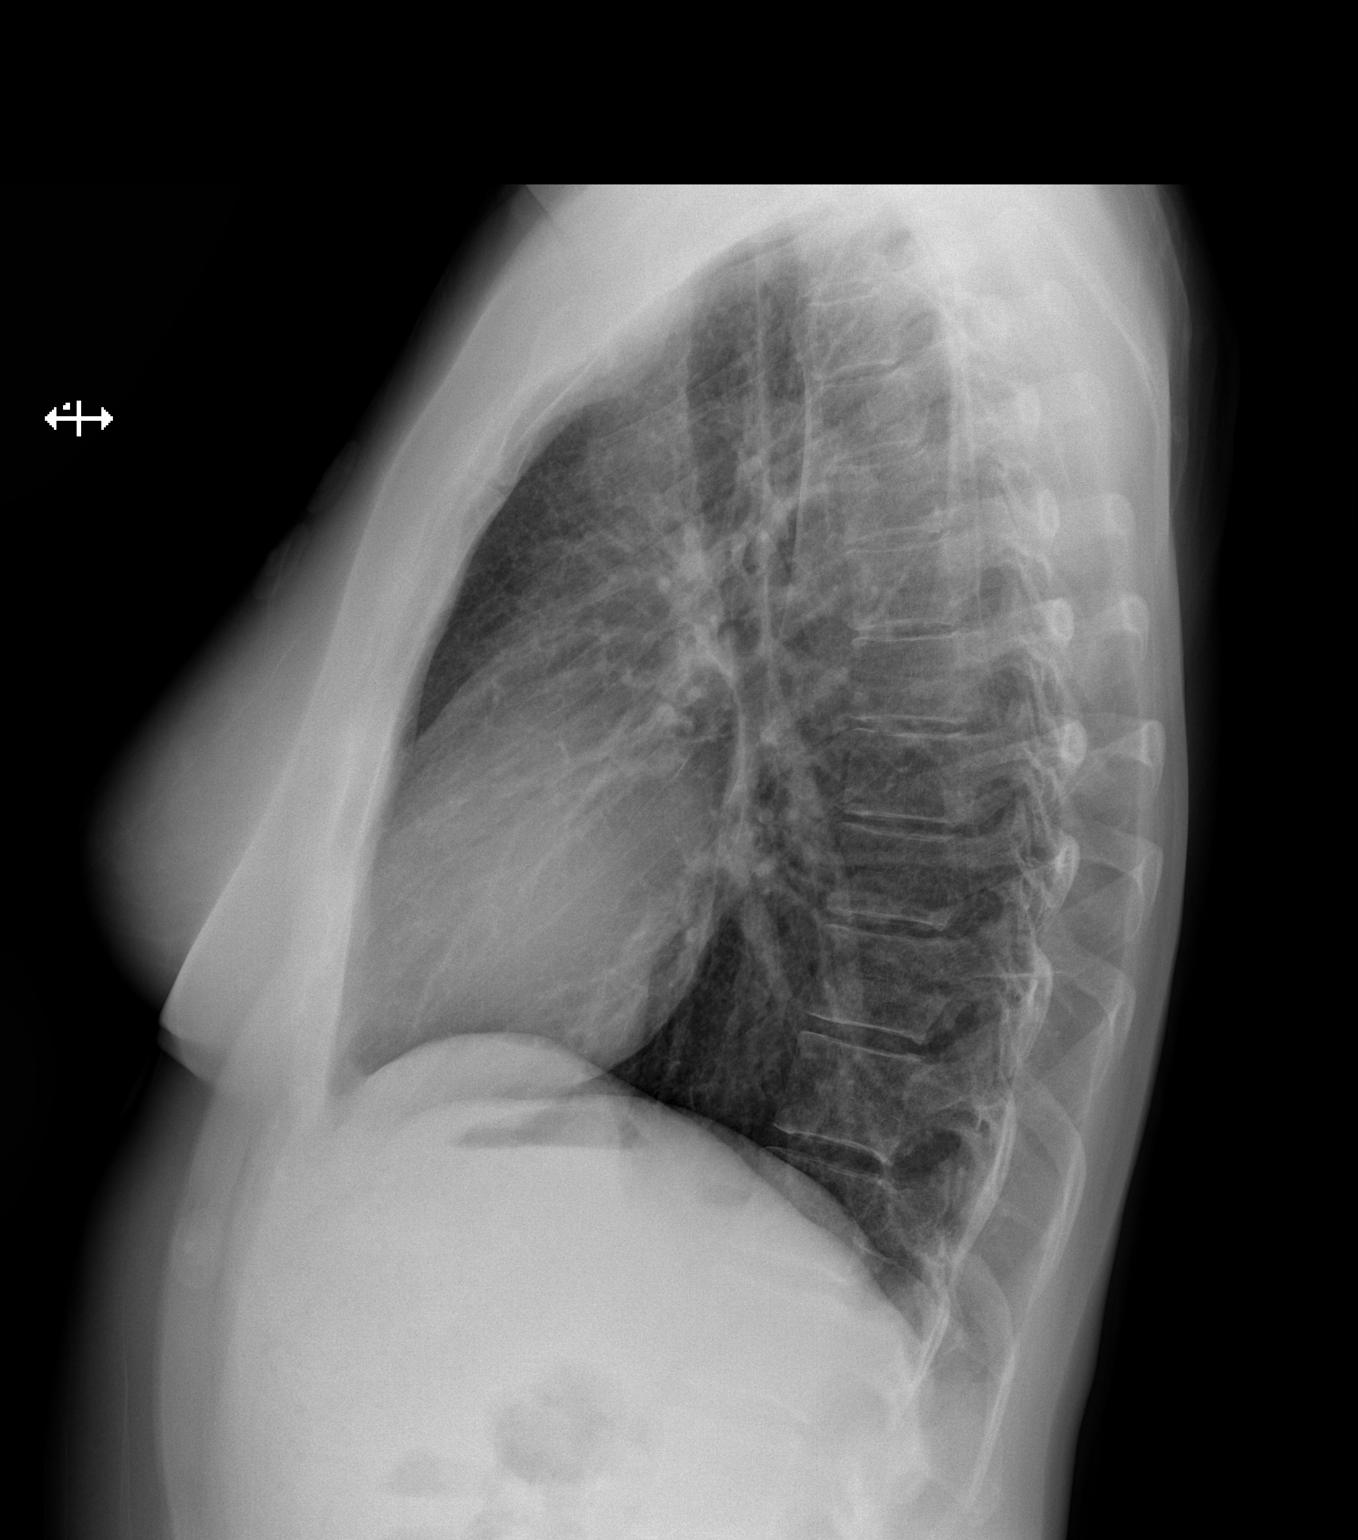

[2 of 2 positions shown; findings below may reference images not displayed]

FINDINGS: Midline trachea. Normal heart size and mediastinal contours. No
pleural effusion or pneumothorax. Diffuse peribronchial thickening.
EKG connector artifacts project over the upper lungs bilaterally
IMPRESSION: 1.  No acute cardiopulmonary disease.
2. Mild peribronchial thickening which may relate to chronic
bronchitis or smoking.

## 2017-11-08 IMAGING — DX DG CHEST 2V
2 series · 2 of 2 positions shown · non-contrast
Comparison: Chest x-ray dated [DATE].

CLINICAL DATA: Beginning methotrexate.  Asymptomatic.

EXAM:
CHEST  2 VIEW

[chest pa]
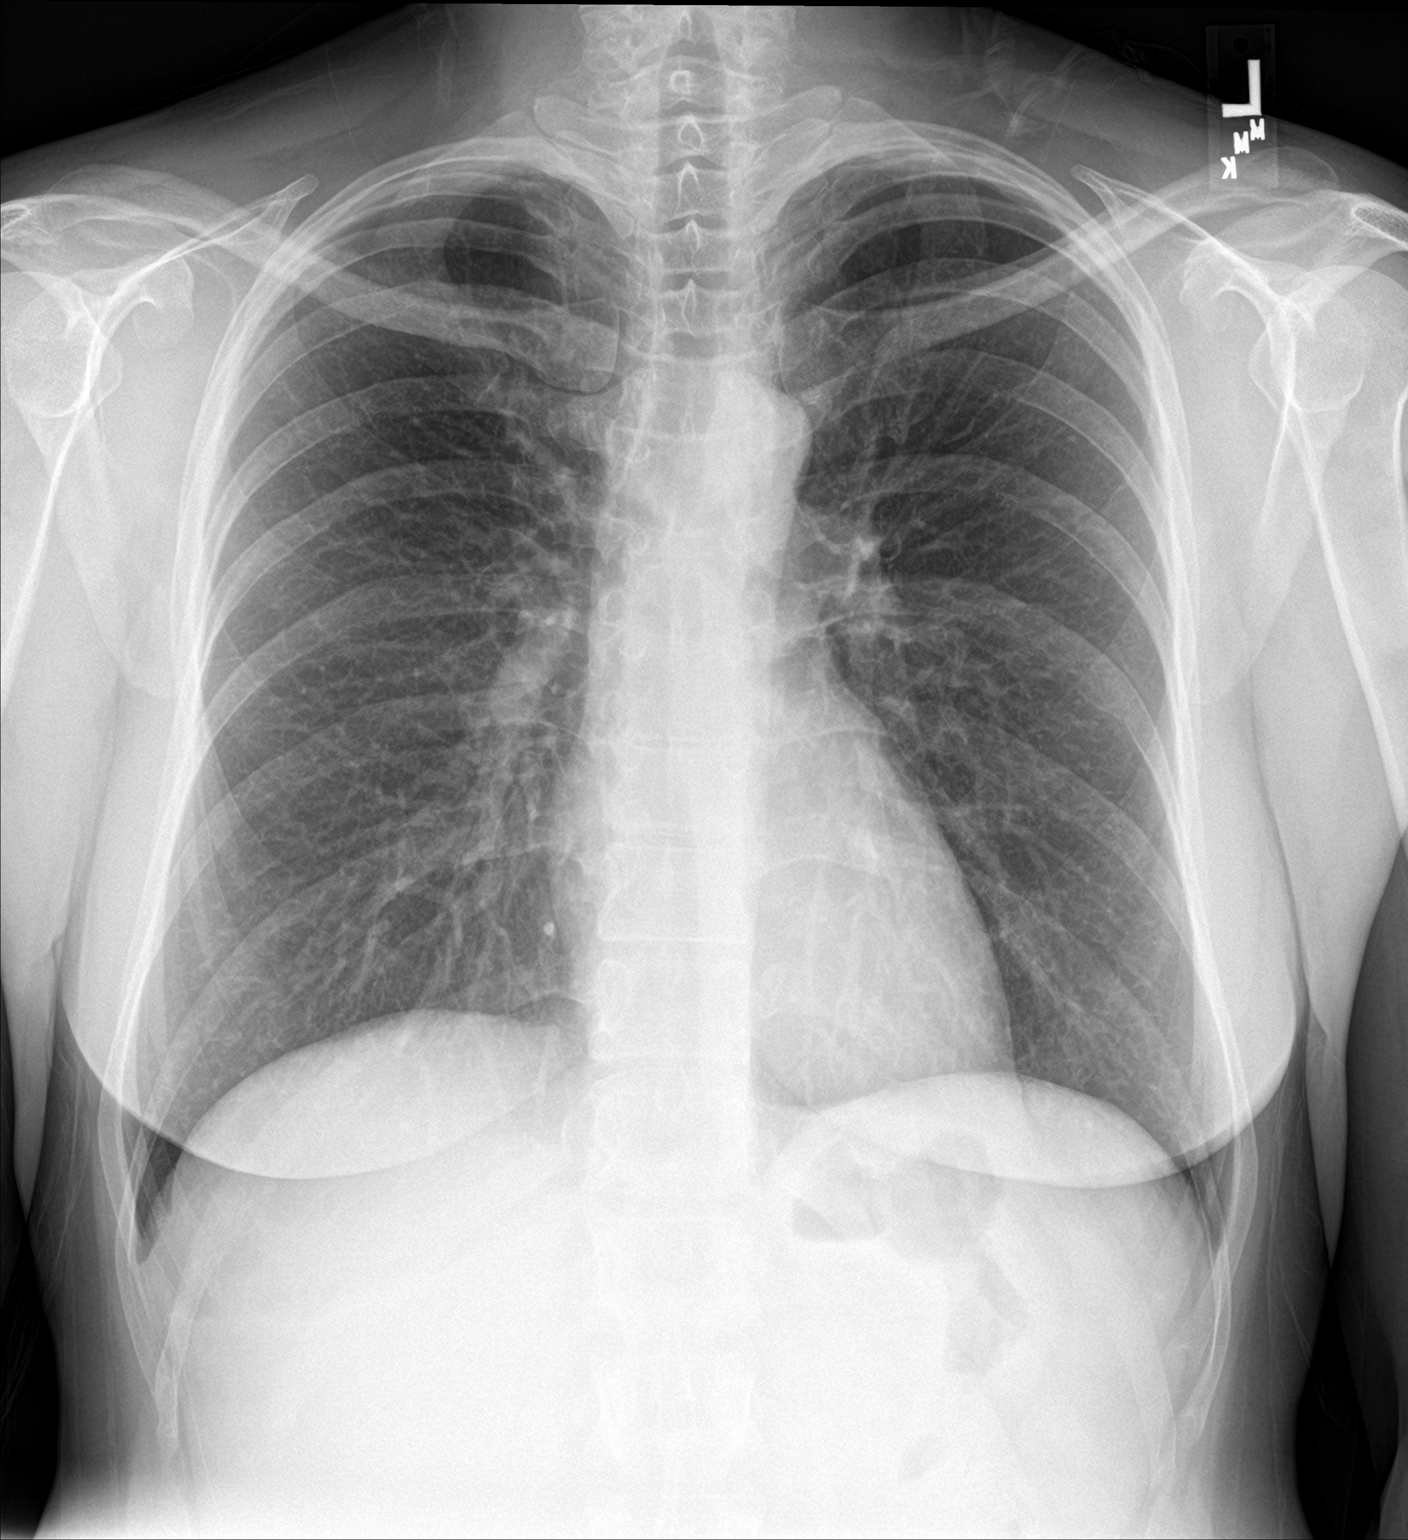

[chest lat]
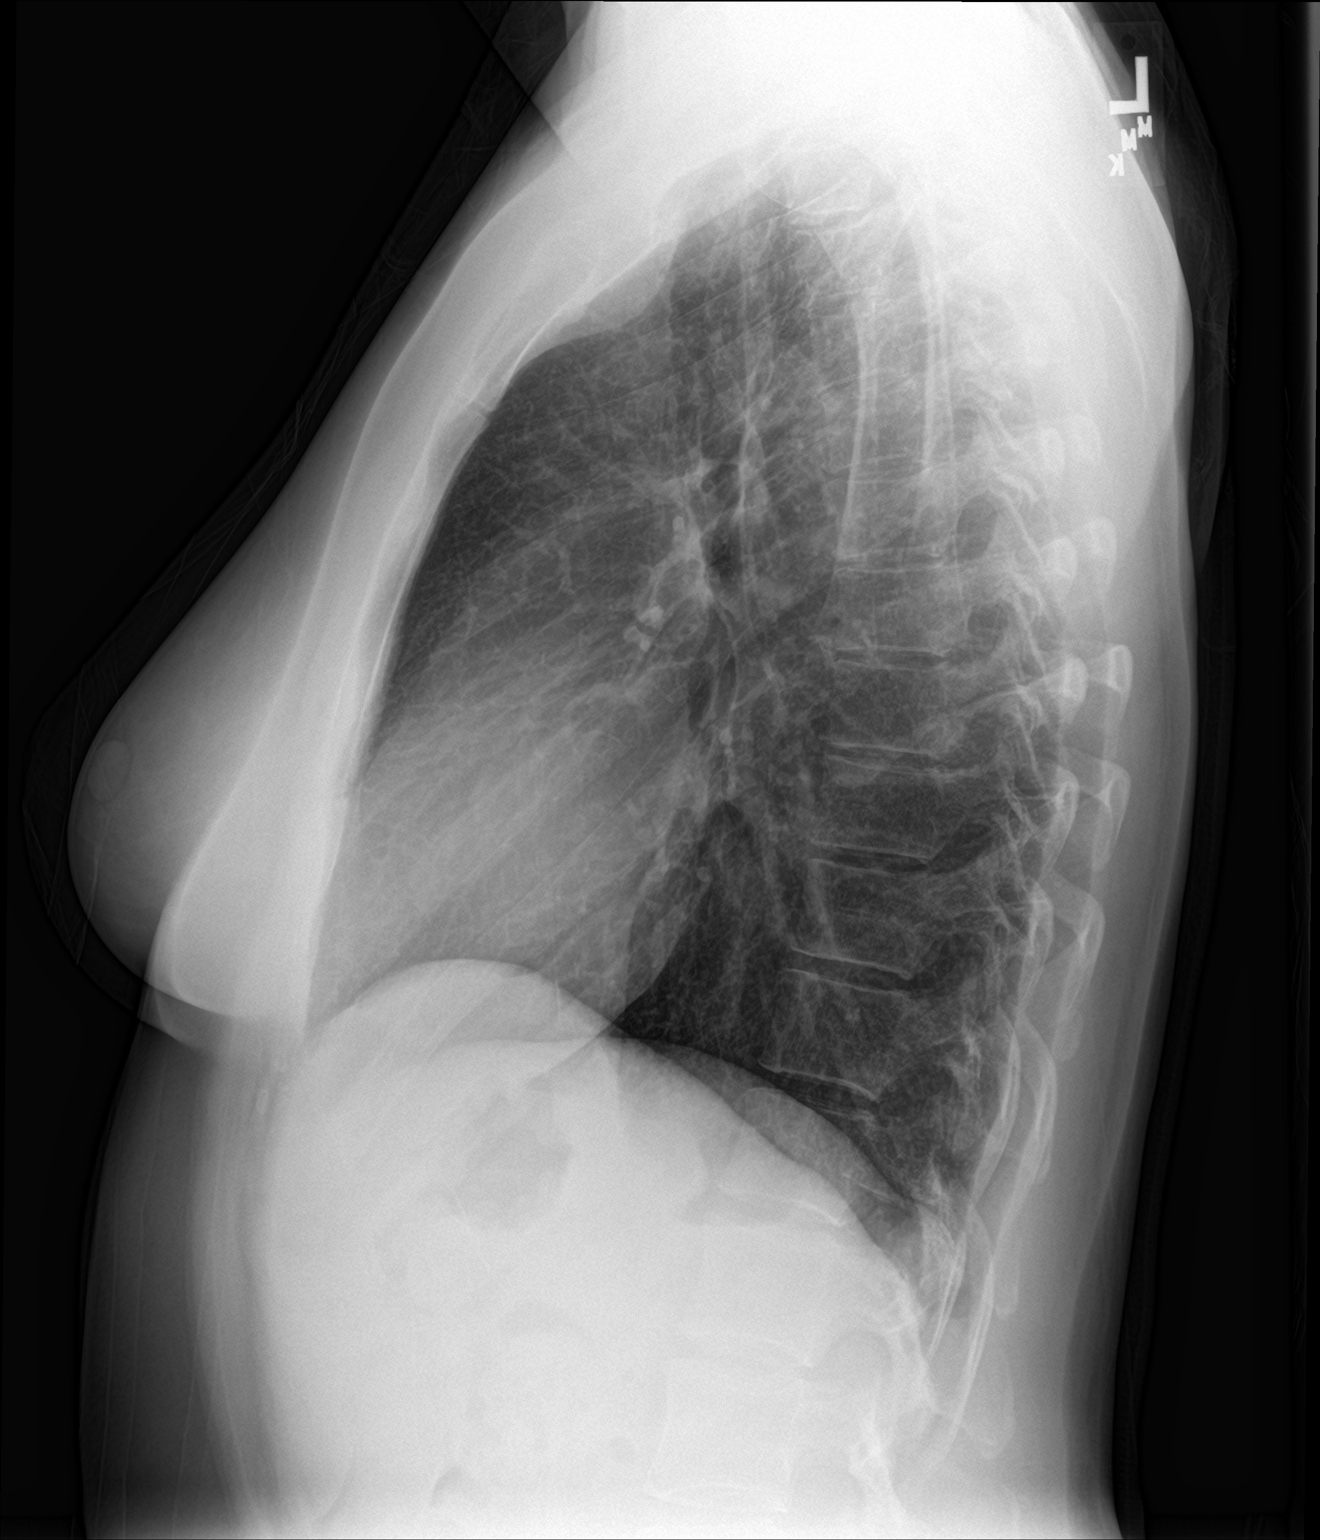

[2 of 2 positions shown; findings below may reference images not displayed]

FINDINGS: The heart size and mediastinal contours are within normal limits.
Both lungs are clear. The visualized skeletal structures are
unremarkable.
IMPRESSION: No active cardiopulmonary disease.

## 2020-10-18 IMAGING — MR MR CERVICAL SPINE W/O CM
5 series · 39 of 48 positions shown · non-contrast
Comparison: [DATE].

CLINICAL DATA: Chronic neck pain with bilateral upper extremity
radiation.

EXAM:
MRI CERVICAL SPINE WITHOUT CONTRAST
TECHNIQUE: Multiplanar, multisequence MR imaging of the cervical spine was
performed. No intravenous contrast was administered.

[Series 5: T2 · sagittal · 3.0mm · 0.62mm/px · 7 of 15 slices shown (1 of 2)]
[im 1/15]
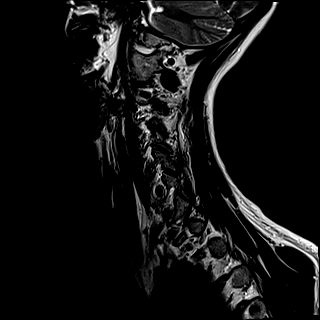
[im 3/15]
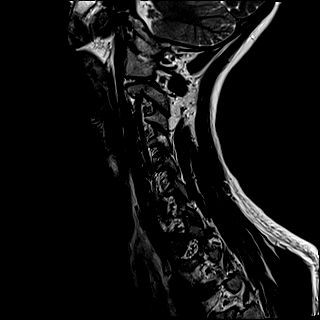
[im 5/15]
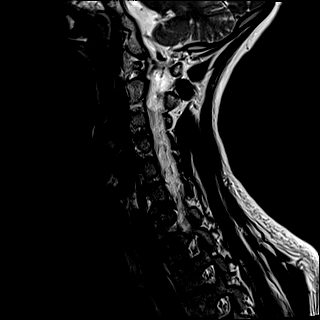
[im 8/15]
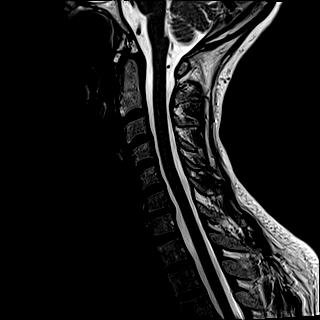
[im 10/15]
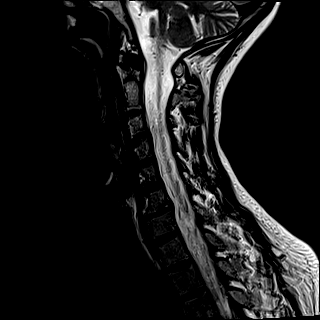
[im 12/15]
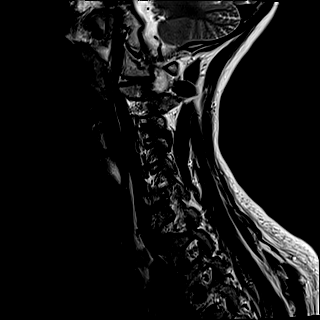
[im 15/15]
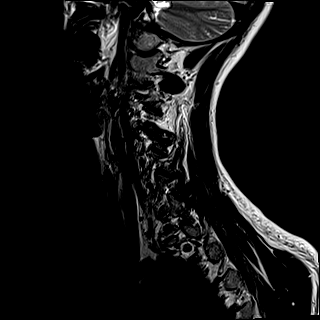

[Series 6: FLAIR · sagittal · 3.0mm · 0.78mm/px · 7 of 15 slices shown]
[im 1/15]
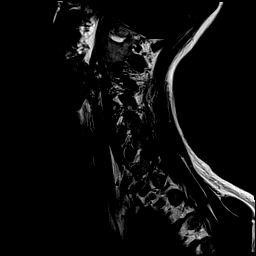
[im 3/15]
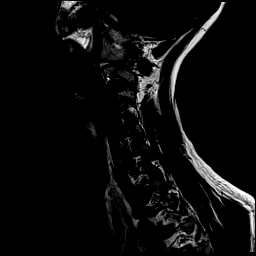
[im 5/15]
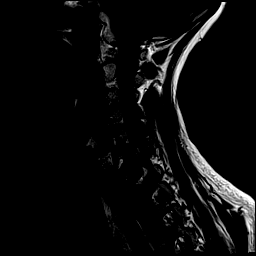
[im 8/15]
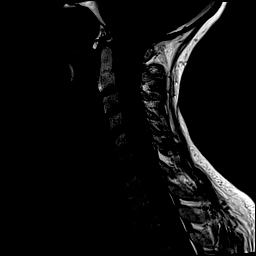
[im 10/15]
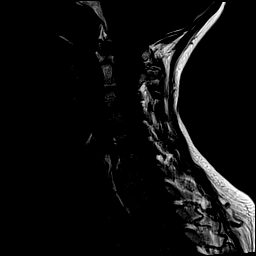
[im 12/15]
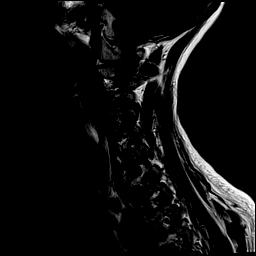
[im 15/15]
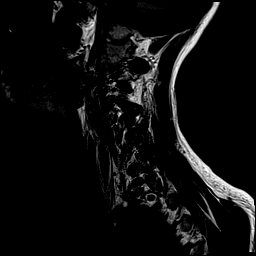

[Series 7: STIR · sagittal · 3.0mm · 0.62mm/px · 7 of 15 slices shown]
[im 1/15]
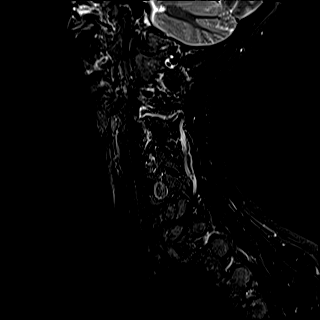
[im 3/15]
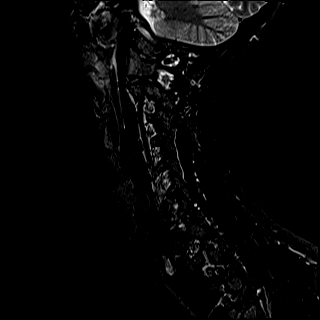
[im 5/15]
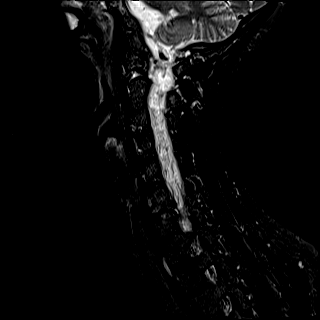
[im 8/15]
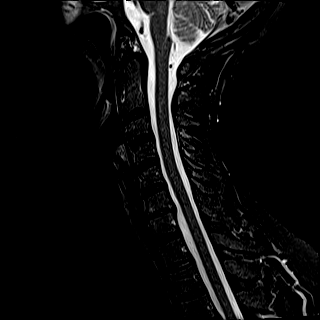
[im 10/15]
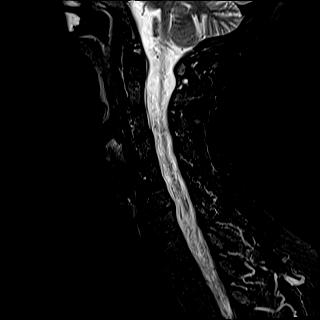
[im 12/15]
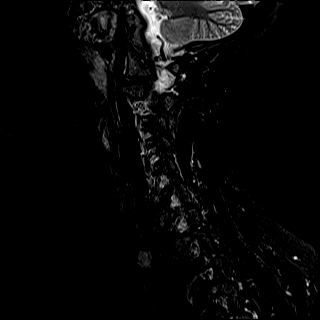
[im 15/15]
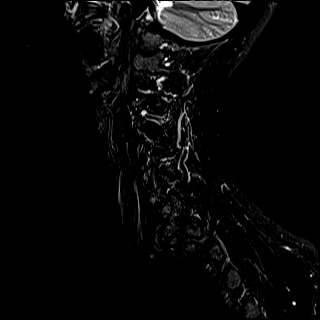

[Series 8: T2 · axial · 3.0mm · 0.70mm/px · z∈[-119,-27]mm · 10 of 28 slices shown (2 of 2)]
[im 1/28]
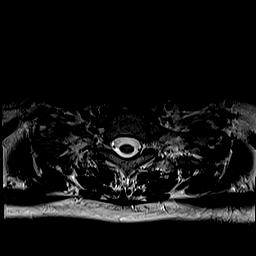
[im 3/28]
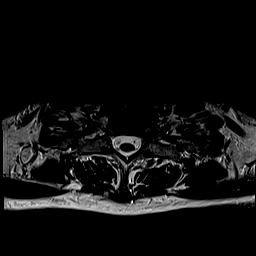
[im 5/28]
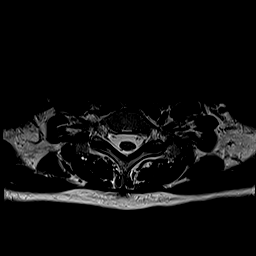
[im 10/28]
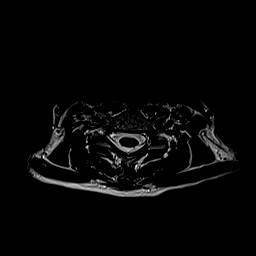
[im 12/28]
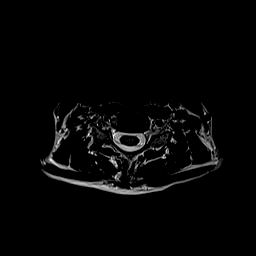
[im 14/28]
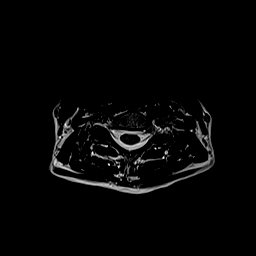
[im 16/28]
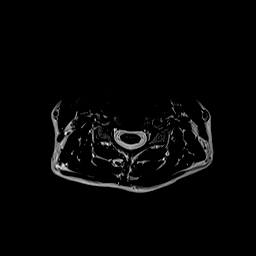
[im 19/28]
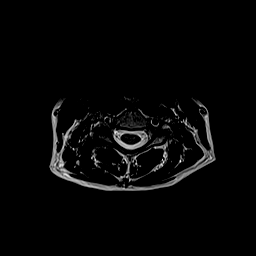
[im 23/28]
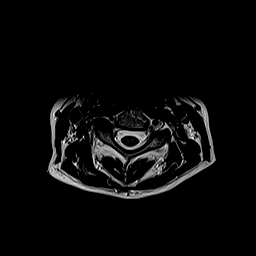
[im 28/28]
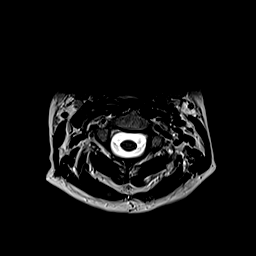

[Series 9: ax mpgr · axial · 3.0mm · 0.35mm/px · z∈[-119,-23]mm · 8 of 29 slices shown]
[im 1/29]
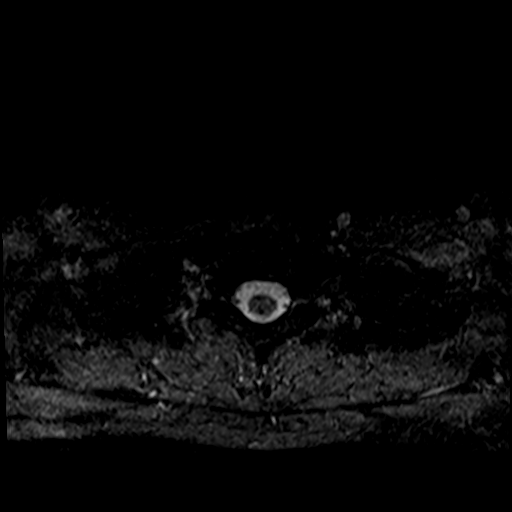
[im 5/29]
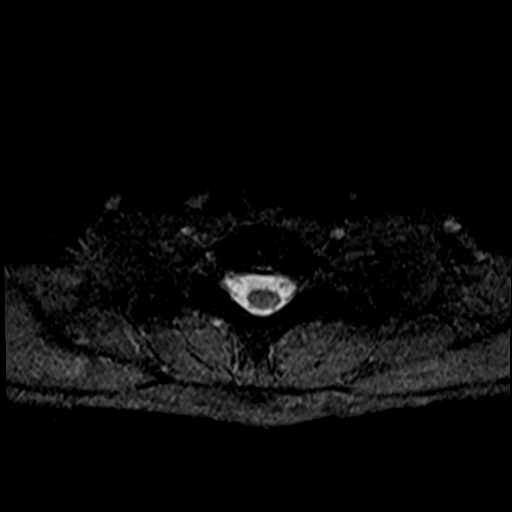
[im 9/29]
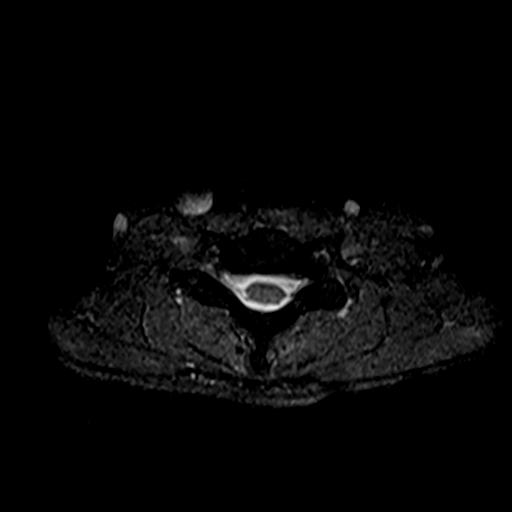
[im 13/29]
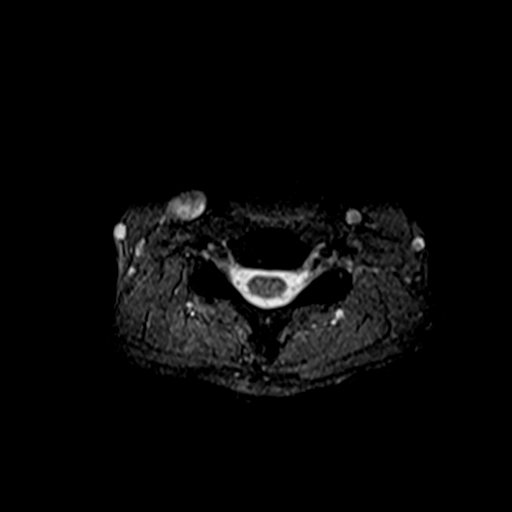
[im 16/29]
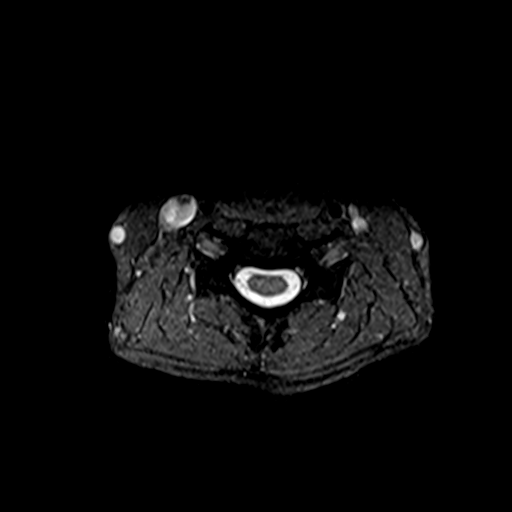
[im 20/29]
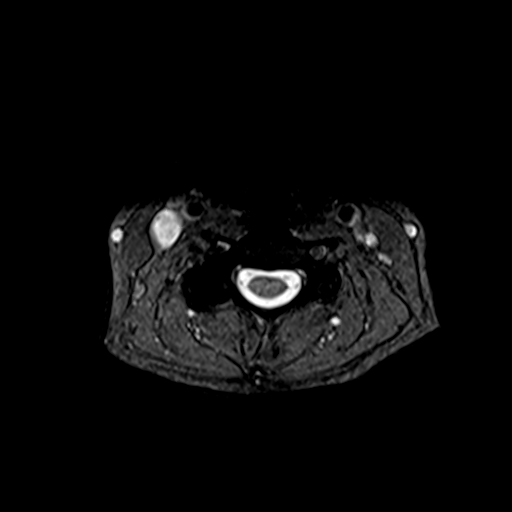
[im 24/29]
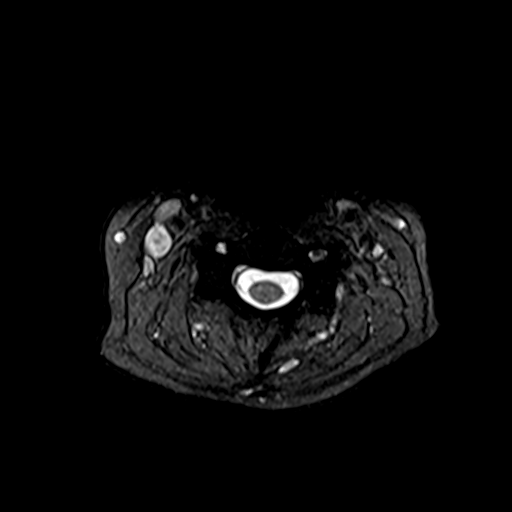
[im 29/29]
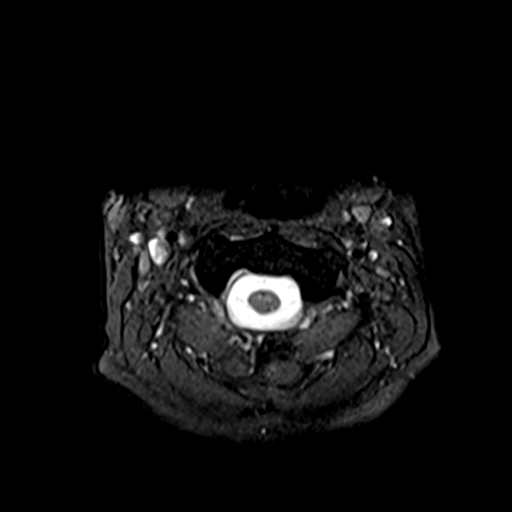

[39 of 48 positions shown; findings below may reference images not displayed]

FINDINGS: Alignment: Straightening of lordosis.

Vertebrae: Vertebral body heights are preserved. C6-7 Modic type 1
endplate degenerative changes. No aggressive osseous lesion.

Cord: Normal signal and morphology.

Posterior Fossa, vertebral arteries: Negative.

Disc levels: Multilevel desiccation.

C2-3: No significant disc bulge. Patent spinal canal and neural
foramen.

C3-4: No significant disc bulge. Right predominant uncovertebral and
facet hypertrophy with mild to moderate right neural foraminal
narrowing. Patent spinal canal and left neural foramen.

C4-5: No significant disc bulge. Uncovertebral and facet
degenerative spurring. Patent spinal canal and neural foramen.

C5-6: Minimal disc bulge with uncovertebral and facet degenerative
spurring. Patent spinal canal and neural foramen.

C6-7: Disc osteophyte complex with superimposed right foraminal and
left subarticular protrusions. Uncovertebral hypertrophy. Minimal
spinal canal narrowing. Mild left greater than right neural
foraminal narrowing.

C7-T1: No significant disc bulge. Uncovertebral and facet
degenerative spurring. Patent spinal canal and neural foramen.

Paraspinal tissues: Negative.
IMPRESSION: Mild multilevel spondylosis. Moderate right C3-4 neural foraminal
narrowing.

Minimal spinal canal and mild bilateral neural foraminal narrowing
at the C6-7 level.

## 2020-11-19 IMAGING — XA DG INJECT/[PERSON_NAME] INC NEEDLE/CATH/PLC EPI/CERV/THOR W/IMG
2 series · 2 of 2 positions shown · non-contrast
Comparison: none

CLINICAL DATA: Cervicalgia.  Pain into the left shoulder.

[Series 2: ortho adipose · 1 of 1 slices shown (1 of 2)]
[im 1/1]
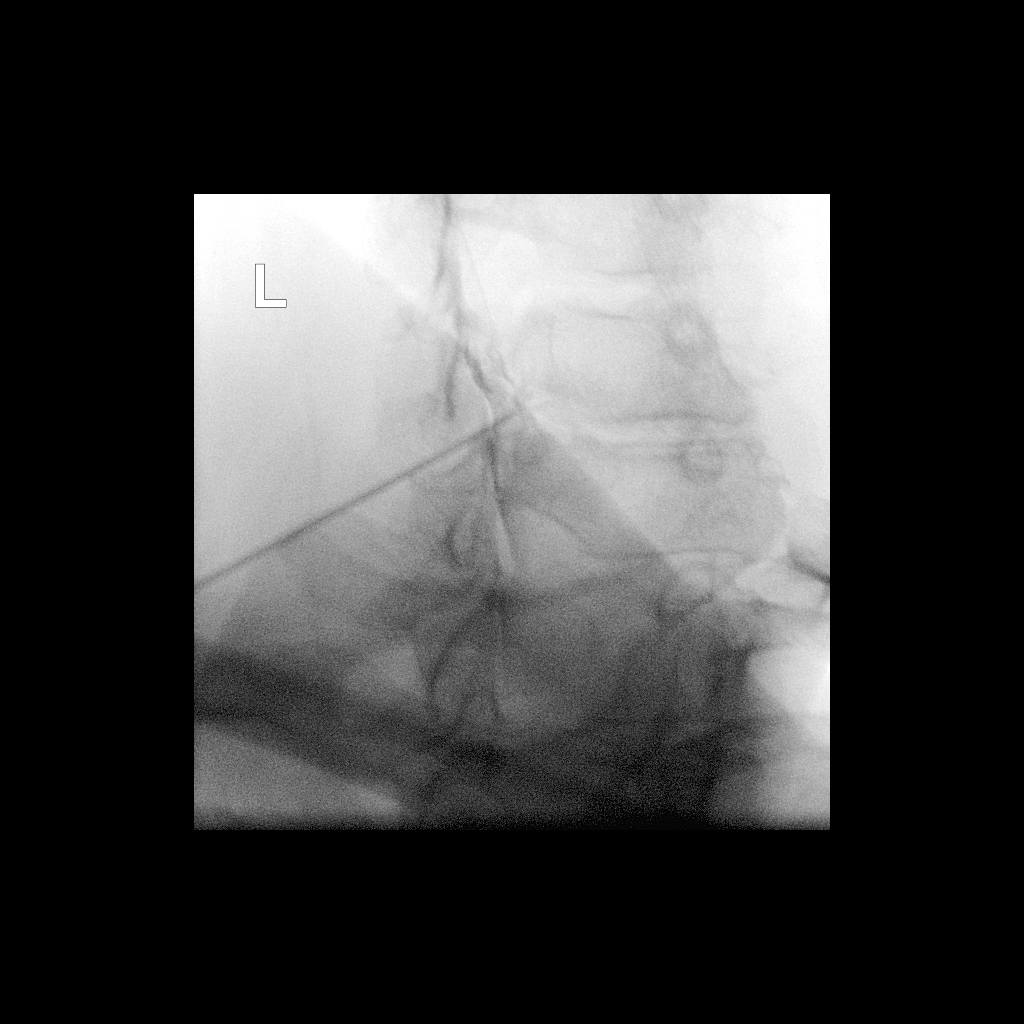

[Series 3: ortho adipose · 1 of 1 slices shown (2 of 2)]
[im 1/1]
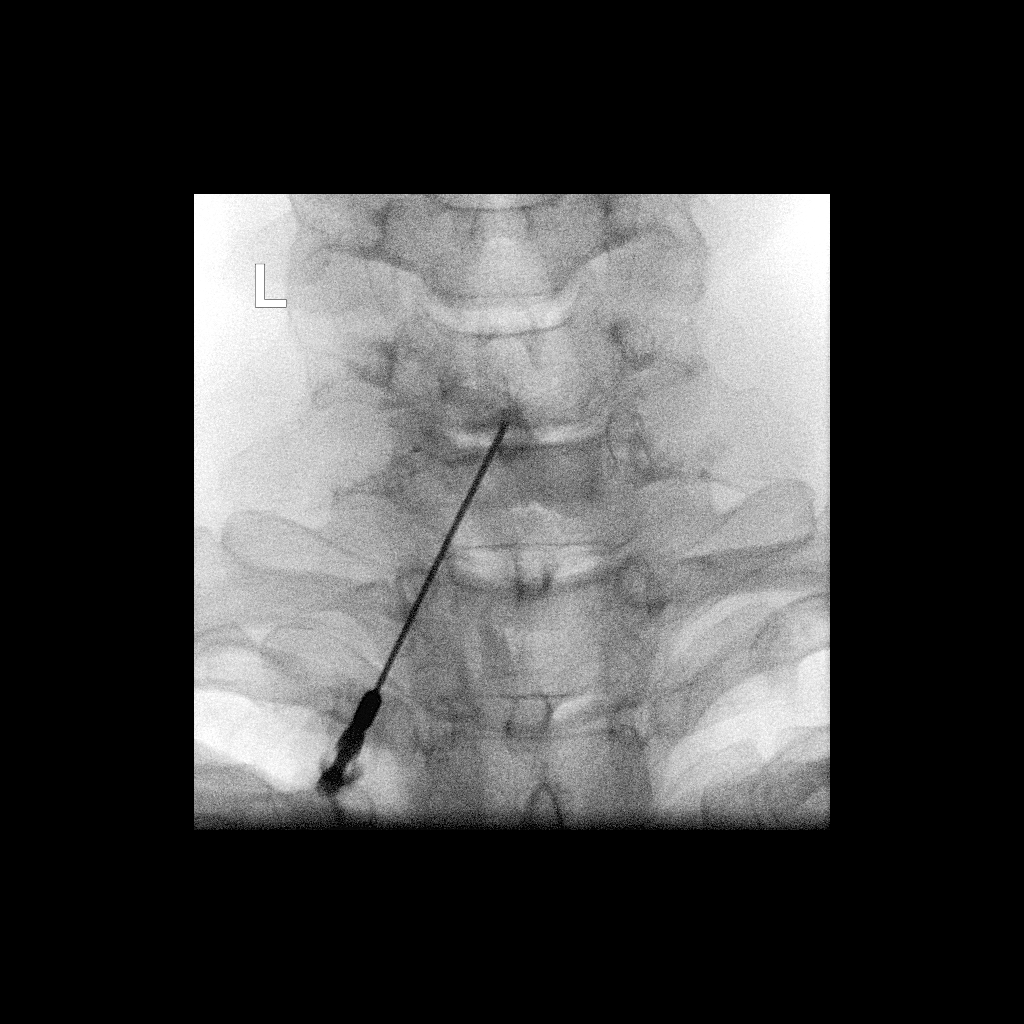

[2 of 2 positions shown; findings below may reference images not displayed]

FLUOROSCOPY TIME:  Radiation Exposure Index (as provided by the
fluoroscopic device): 5.15 uGy*m2

PROCEDURE:
CERVICAL EPIDURAL INJECTION

An interlaminar approach was performed on the left at C6-7. A 20
gauge epidural needle was advanced using loss-of-resistance
technique.

DIAGNOSTIC EPIDURAL INJECTION

Injection of Isovue-M 300 shows a good epidural pattern with spread
above and below the level of needle placement, primarily on the
left. No vascular opacification is seen. THERAPEUTIC

EPIDURAL INJECTION

1.5 ml of Kenalog 40 mixed with 1 ml of 1% Lidocaine and 2 ml of
normal saline were then instilled. The procedure was well-tolerated,
and the patient was discharged thirty minutes following the
injection in good condition.
IMPRESSION: Technically successful first epidural injection on the left at
C7-T1.

## 2021-11-09 IMAGING — XA DG INJECT/[PERSON_NAME] INC NEEDLE/CATH/PLC EPI/CERV/THOR W/IMG
2 series · 2 of 2 positions shown · non-contrast
Comparison: none

CLINICAL DATA: Cervicalgia. Bilateral shoulder pain. Patient had
excellent relief from prior cervical epidural steroid injection.
Pain has since recurred. Cervical disc disease at C6-7.

[Series 1: ortho adipose · 1 of 1 slices shown (1 of 2)]
[im 1/1]
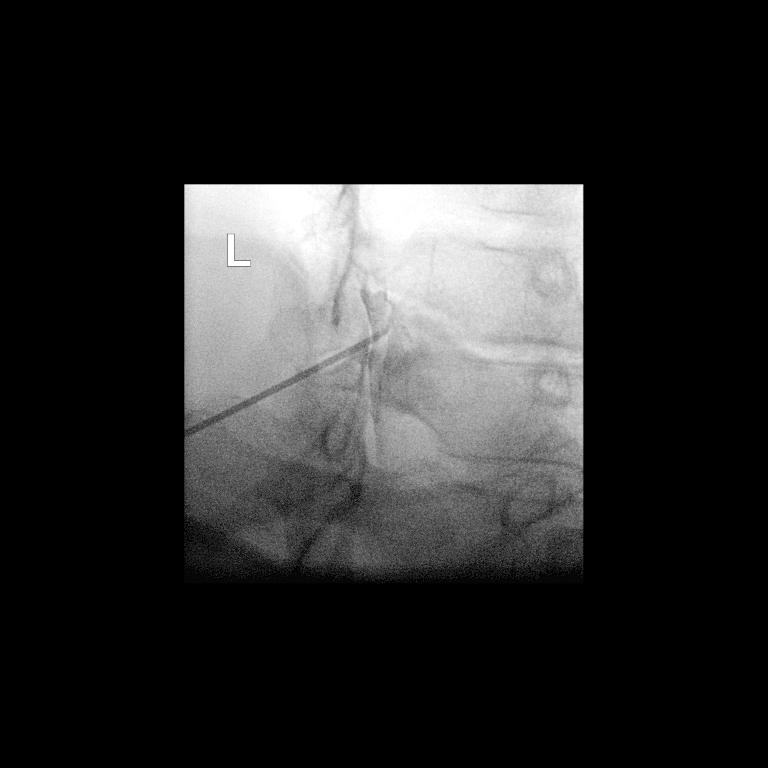

[Series 2: ortho adipose · 1 of 1 slices shown (2 of 2)]
[im 1/1]
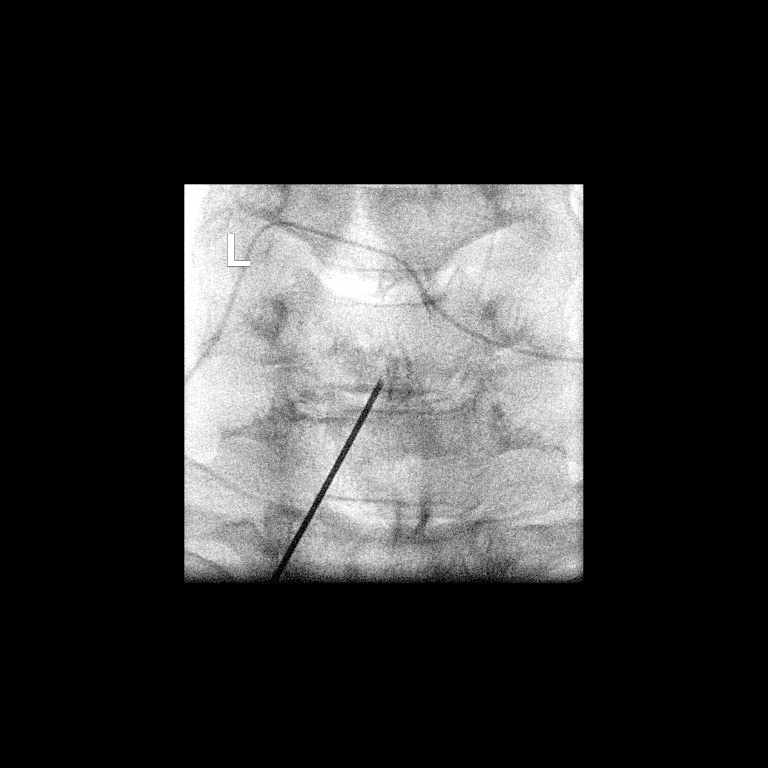

[2 of 2 positions shown; findings below may reference images not displayed]

FLUOROSCOPY TIME:  Radiation Exposure Index (as provided by the
fluoroscopic device): Dose area product 3.48 uGy*m2

PROCEDURE:
CERVICAL EPIDURAL INJECTION

An interlaminar approach was performed on the left at C6-7. A 20
gauge epidural needle was advanced using loss-of-resistance
technique.

DIAGNOSTIC EPIDURAL INJECTION

Injection of Isovue-M 300 shows a good epidural pattern with spread
above and below the level of needle placement, primarily on the
left. No vascular opacification is seen. THERAPEUTIC

EPIDURAL INJECTION

1.5 ml of Kenalog 40 mixed with 1 ml of 1% Lidocaine and 2 ml of
normal saline were then instilled. The procedure was well-tolerated,
and the patient was discharged thirty minutes following the
injection in good condition.
IMPRESSION: Technically successful first epidural injection on the left at C6-7.

## 2021-11-09 IMAGING — MR MR LUMBAR SPINE W/O CM
4 of 5 series · 26 of 48 positions shown · non-contrast
Comparison: None.

CLINICAL DATA: Injured lower back roller skating 1 year ago. Re
injured in [REDACTED] while working out. Pain affects both
legs.(Numbness) No surgery or injections.

EXAM:
MRI LUMBAR SPINE WITHOUT CONTRAST
TECHNIQUE: Multiplanar, multisequence MR imaging of the lumbar spine was
performed. No intravenous contrast was administered.

[Series 3: T2 · sagittal · 4.0mm · 1.09mm/px · 6 of 17 slices shown (1 of 2)]
[im 1/17]
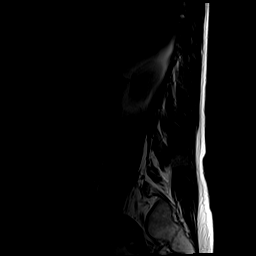
[im 4/17]
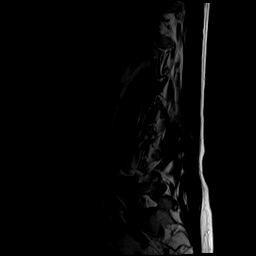
[im 7/17]
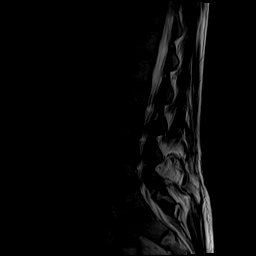
[im 10/17]
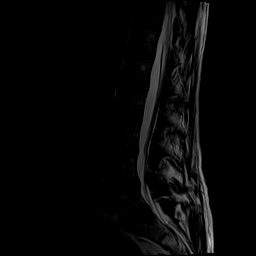
[im 13/17]
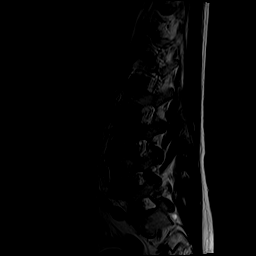
[im 17/17]
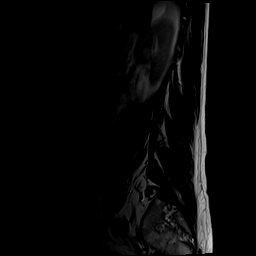

[Series 5: T1 · sagittal · 4.0mm · 1.09mm/px · 6 of 17 slices shown (1 of 2)]
[im 1/17]
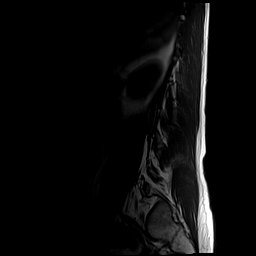
[im 4/17]
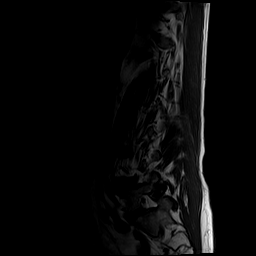
[im 7/17]
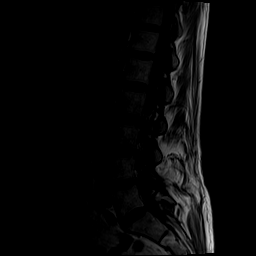
[im 10/17]
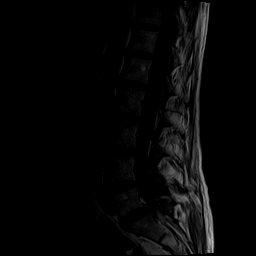
[im 13/17]
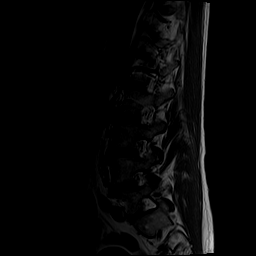
[im 17/17]
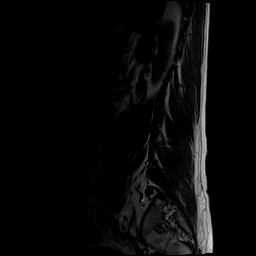

[Series 6: T2 · axial · 4.0mm · 0.39mm/px · z∈[-148,+85]mm · 9 of 44 slices shown (2 of 2)]
[im 1/44]
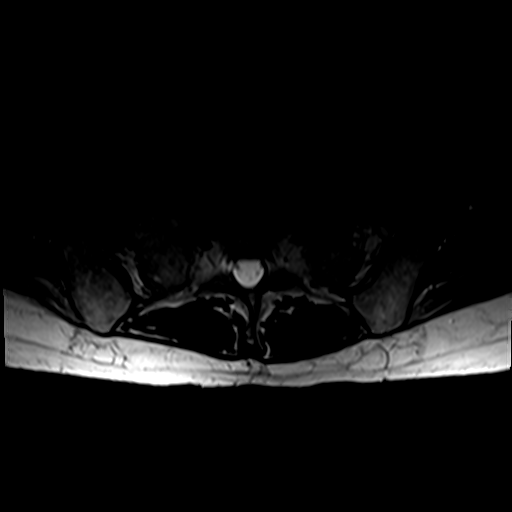
[im 7/44]
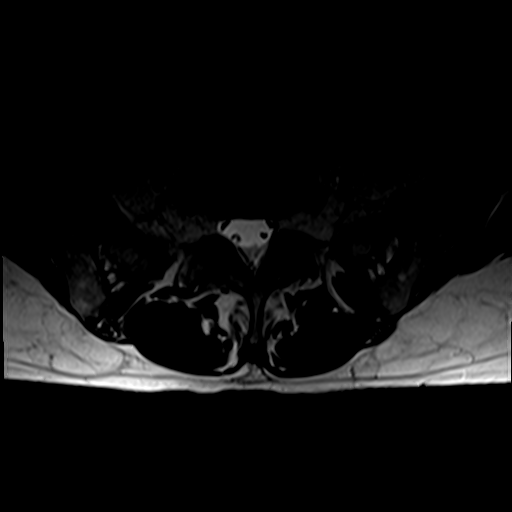
[im 13/44]
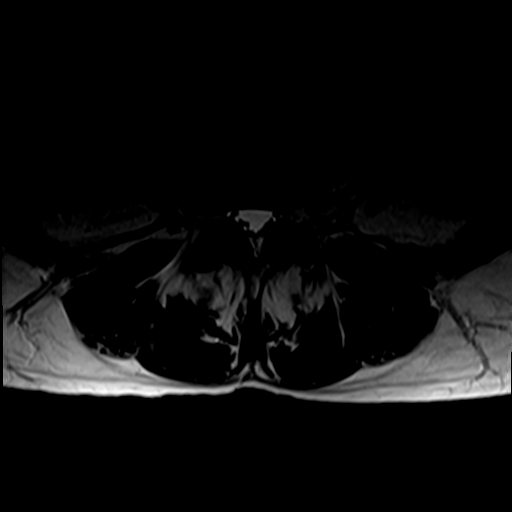
[im 19/44]
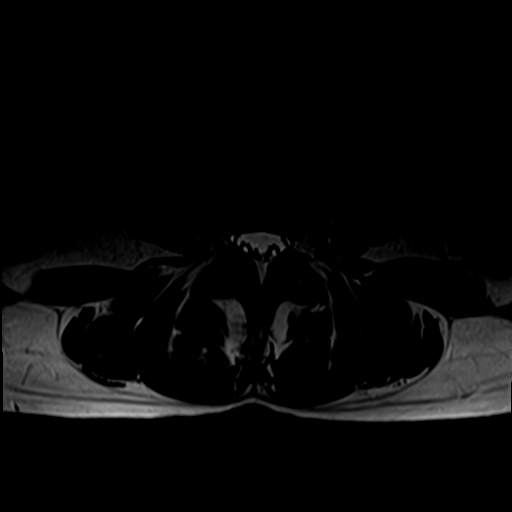
[im 22/44]
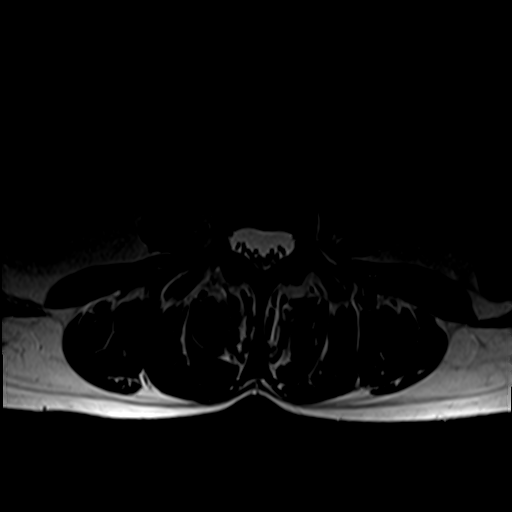
[im 25/44]
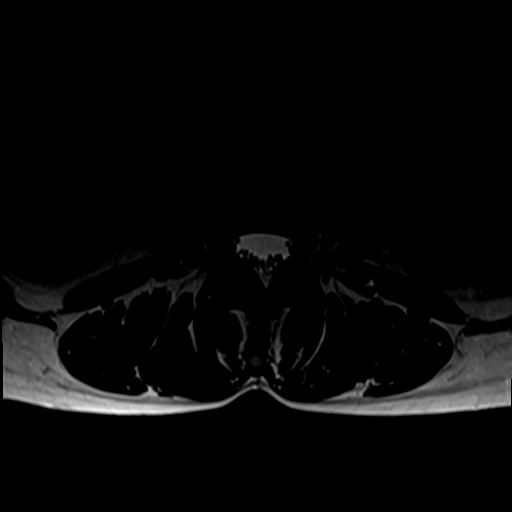
[im 31/44]
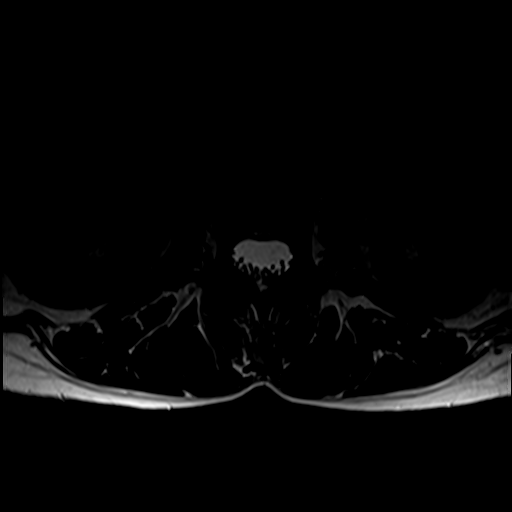
[im 37/44]
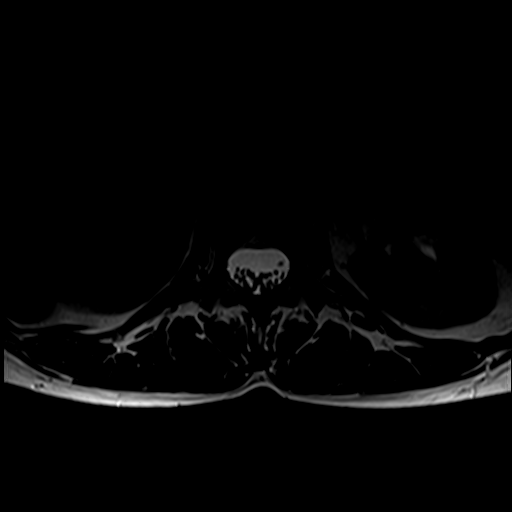
[im 44/44]
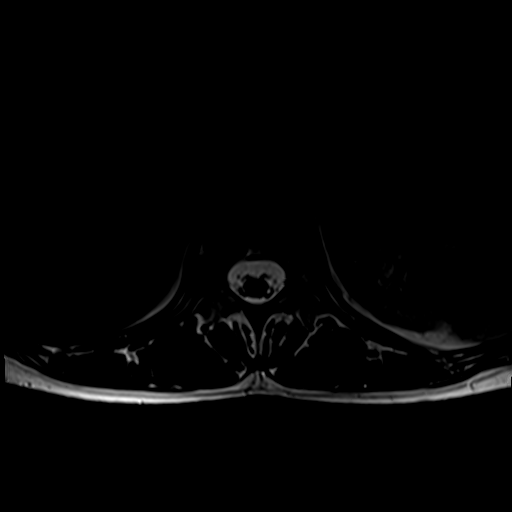

[Series 7: T1 · axial · 4.0mm · 0.39mm/px · z∈[-148,+52]mm · 5 of 44 slices shown (2 of 2)]
[im 1/44]
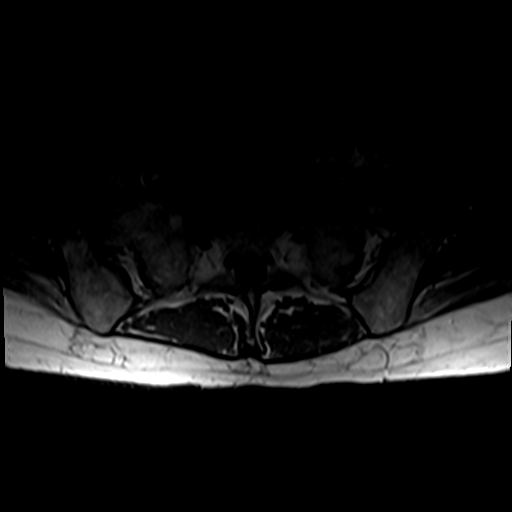
[im 7/44]
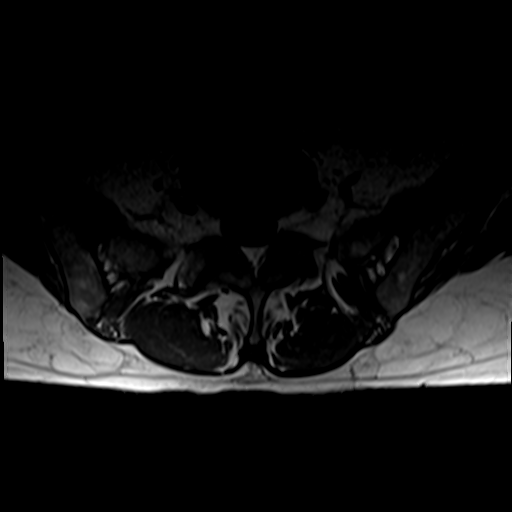
[im 13/44]
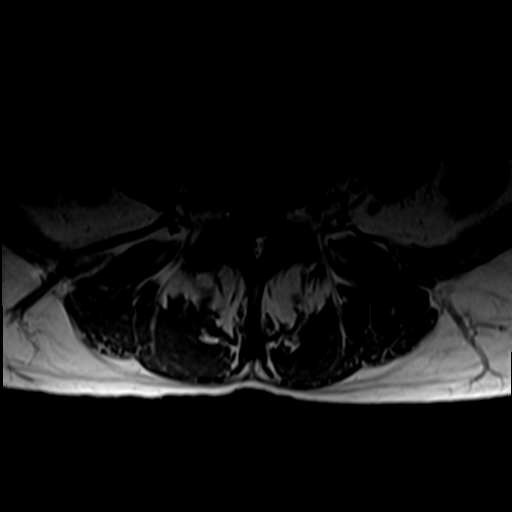
[im 22/44]
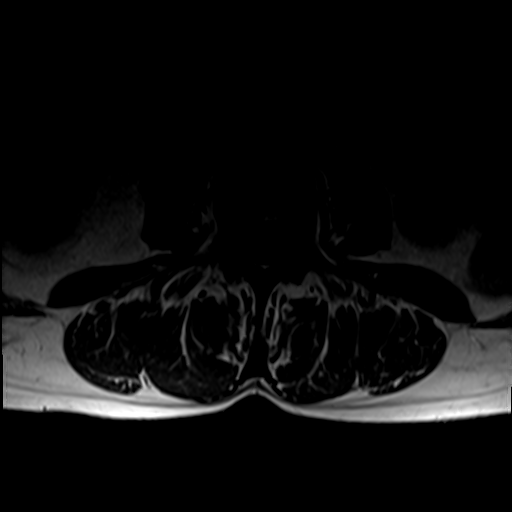
[im 37/44]
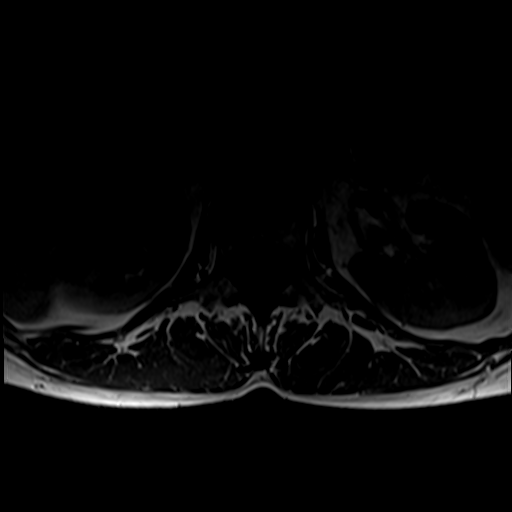

[26 of 48 positions shown; findings below may reference images not displayed]

FINDINGS: Segmentation: For the purpose of numbering nomenclature on this
report, axial series 6, image 40 corresponds to the L5-S1 disc
space.

Alignment:  No sagittal spondylolisthesis.

Vertebrae: Vertebral body heights are maintained. There is increased
T1 increased T2 signal fat intensity with likely minimal increased
STIR signal marrow edema around an apparent curvilinear fracture
line at the superior aspect of the S2 vertebral body with only 2 mm
anterior cortical step-off, suspicious for subacute to chronic
fracture.

Small fat intensity hemangiomas within the posterior L1 and L4
vertebral bodies.

There is mild-to-moderate edema within the bilateral L5 pedicles.
Possible bilateral chronic L5 pars defects. Minimal marrow edema
within the inferior articulating facets of the L4 vertebral body at
the L4-5 facet joint. Tiny facet joint synovial cysts are seen
extending posteriorly and inferiorly from the L4-5 and L5-S1 facet
joints, without nerve impingement. There is mild marrow edema within
the anterior aspect of the L4 spinous process.

Conus medullaris and cauda equina: Conus extends to the mid L1
level. Conus and cauda equina appear normal.

Paraspinal and other soft tissues: Unremarkable

Disc levels:

L1-2: No posterior disc bulge, central canal narrowing, or neural
foraminal stenosis.

L2-3: No posterior disc bulge, central canal narrowing, or neural
foraminal stenosis.

L3-4: Mild to moderate bilateral facet joint hypertrophy. Minimal
posterior disc bulge. Borderline mild bilateral lateral recess
stenosis and borderline mild central canal stenosis. Mild extension
of disc into the inferior aspect of the bilateral neural foramina
with borderline mild bilateral neural foraminal stenosis.

L4-5: Mild-to-moderate bilateral facet joint hypertrophy. Mild
broad-based posterior disc osteophyte complex with bilateral
intraforaminal extension. Mild right-greater-than-left neural
foraminal stenosis. Mild left-greater-than-right lateral recess
stenosis. Mild central canal stenosis.

L5-S1: Mild-to-moderate right and mild left facet joint hypertrophy.
Mild posterior disc bulge. Very mild left neural foraminal narrowing
without true stenosis. No central canal stenosis.
IMPRESSION: :
IMPRESSION: 1. Within the S1 vertebral body there appears to be fat intensity
surrounding a curvilinear possible nondisplaced fracture with
minimal anterior cortical step-off. There is minimal marrow edema in
this region. This is suggestive of a subacute to chronic fracture.
2. Bilateral L4-5 facet joint and bilateral L5 pars marrow edema.
Cannot exclude bilateral L5 pars defects. A report from a prior
[DATE] lumbar spine radiographs (images unavailable to me)
describes chronic bilateral L5 pars defects.
3. Multilevel degenerative disc and joint changes as above.
4. L4-5 mild central canal stenosis and mild left-greater-than-right
lateral recess stenosis, and mild right greater than left neural
foraminal stenosis.
